# Patient Record
Sex: Female | Born: 1980 | Race: White | Hispanic: No | Marital: Married
Health system: Southern US, Community
[De-identification: ages and names within clinical notes are randomized; demographics above are authoritative.]

## PROBLEM LIST (undated history)

## (undated) DIAGNOSIS — I10 Essential (primary) hypertension: Secondary | ICD-10-CM

## (undated) DIAGNOSIS — R7303 Prediabetes: Secondary | ICD-10-CM

## (undated) DIAGNOSIS — E119 Type 2 diabetes mellitus without complications: Secondary | ICD-10-CM

## (undated) DIAGNOSIS — D649 Anemia, unspecified: Secondary | ICD-10-CM

## (undated) DIAGNOSIS — T4145XA Adverse effect of unspecified anesthetic, initial encounter: Secondary | ICD-10-CM

## (undated) DIAGNOSIS — E282 Polycystic ovarian syndrome: Secondary | ICD-10-CM

## (undated) DIAGNOSIS — F419 Anxiety disorder, unspecified: Secondary | ICD-10-CM

## (undated) DIAGNOSIS — R0602 Shortness of breath: Secondary | ICD-10-CM

## (undated) DIAGNOSIS — Z973 Presence of spectacles and contact lenses: Secondary | ICD-10-CM

## (undated) DIAGNOSIS — N8501 Benign endometrial hyperplasia: Secondary | ICD-10-CM

## (undated) DIAGNOSIS — N921 Excessive and frequent menstruation with irregular cycle: Secondary | ICD-10-CM

## (undated) HISTORY — PX: TONSILLECTOMY: SUR1361

## (undated) HISTORY — PX: WISDOM TOOTH EXTRACTION: SHX21

## (undated) HISTORY — PX: DILATION AND CURETTAGE OF UTERUS: SHX78

---

## 2001-08-28 DIAGNOSIS — T8859XA Other complications of anesthesia, initial encounter: Secondary | ICD-10-CM

## 2001-08-28 HISTORY — DX: Other complications of anesthesia, initial encounter: T88.59XA

## 2006-12-07 ENCOUNTER — Ambulatory Visit (HOSPITAL_COMMUNITY): Admission: RE | Admit: 2006-12-07 | Discharge: 2006-12-07 | Payer: Self-pay | Admitting: Obstetrics and Gynecology

## 2012-04-17 ENCOUNTER — Other Ambulatory Visit: Payer: Self-pay | Admitting: Obstetrics and Gynecology

## 2012-05-08 ENCOUNTER — Encounter (HOSPITAL_COMMUNITY): Payer: Self-pay

## 2012-05-10 ENCOUNTER — Encounter (HOSPITAL_COMMUNITY): Payer: Self-pay

## 2012-05-10 ENCOUNTER — Encounter (HOSPITAL_COMMUNITY)
Admission: RE | Admit: 2012-05-10 | Discharge: 2012-05-10 | Disposition: A | Discharge status: Home / Self Care | Payer: BC Managed Care – PPO | Source: Ambulatory Visit | Attending: Obstetrics and Gynecology | Admitting: Obstetrics and Gynecology

## 2012-05-10 HISTORY — DX: Polycystic ovarian syndrome: E28.2

## 2012-05-10 HISTORY — DX: Essential (primary) hypertension: I10

## 2012-05-10 HISTORY — DX: Anxiety disorder, unspecified: F41.9

## 2012-05-10 HISTORY — DX: Adverse effect of unspecified anesthetic, initial encounter: T41.45XA

## 2012-05-10 HISTORY — DX: Anemia, unspecified: D64.9

## 2012-05-10 LAB — CBC
Hemoglobin: 9.6 g/dL — ABNORMAL LOW (ref 12.0–15.0)
MCH: 24.7 pg — ABNORMAL LOW (ref 26.0–34.0)
RBC: 3.89 MIL/uL (ref 3.87–5.11)
WBC: 10.2 10*3/uL (ref 4.0–10.5)

## 2012-05-10 LAB — BASIC METABOLIC PANEL
CO2: 26 mEq/L (ref 19–32)
Chloride: 101 mEq/L (ref 96–112)
Glucose, Bld: 194 mg/dL — ABNORMAL HIGH (ref 70–99)
Potassium: 3.3 mEq/L — ABNORMAL LOW (ref 3.5–5.1)
Sodium: 138 mEq/L (ref 135–145)

## 2012-05-10 NOTE — Patient Instructions (Addendum)
Your procedure is scheduled on:05/14/12  Enter through the Main Entrance at :1130 AM Pick up desk phone and dial [phone] and inform us of your arrival.  Please call [phone] if you have any problems the morning of surgery.  Remember: Do not eat after midnight:MONDAY Do not drink after:9AM TUESDAY  Take these meds the morning of surgery with a sip of water:BLOOD PRESSURE MED  HOLD METFORMIN FOR 24 HOURS PRIOR TO SURGERY  DO NOT wear jewelry, eye make-up, lipstick,body lotion, or dark fingernail polish. Do not shave for 48 hours prior to surgery.  If you are to be admitted after surgery, leave suitcase in car until your room has been assigned. Patients discharged on the day of surgery will not be allowed to drive home.   Remember to use your Hibiclens as instructed.

## 2012-05-10 NOTE — Pre-Procedure Instructions (Signed)
I informed Dr. Arby Barrette that pt had an elevated temp during 2003 D&C. She has had surgery since then at [facility] w/o fever or problems.

## 2012-05-13 NOTE — H&P (Addendum)
31 yo with recent episode of irregular, heavy VB passing tissue presents for further evaluation.  Pt presented to office several weeks ago after episode of very heavy VB and passing tissue.  She brought tissue to office b/c of concern for SAB and pathology revealed endometrial polyp with CAH.    PMHx:  Anemia, HTN, PCOS PSHx:  D&C All:  None Meds:  Metoprolol, metformin, MTV, iron Shx:  Negative x 3 FHx:  DM, HTN  AF, VSS Gen - NAD ABd - soft, NT, ND CV - RRR Lungs - clear bilaterally Ext - NT, no edema  A/P:  Endometrial polyp with CAH Rec hysteroscopy with D&C to further evaluate endometrial cavity R/b/a discussed, questions answered, informed consent

## 2012-05-14 ENCOUNTER — Encounter (HOSPITAL_COMMUNITY): Payer: Self-pay

## 2012-05-14 ENCOUNTER — Encounter (HOSPITAL_COMMUNITY)
Admission: RE | Disposition: A | Discharge status: Home / Self Care | Payer: Self-pay | Source: Ambulatory Visit | Attending: Obstetrics and Gynecology

## 2012-05-14 ENCOUNTER — Encounter (HOSPITAL_COMMUNITY): Payer: Self-pay | Admitting: General Practice

## 2012-05-14 ENCOUNTER — Ambulatory Visit (HOSPITAL_COMMUNITY): Payer: BC Managed Care – PPO

## 2012-05-14 ENCOUNTER — Ambulatory Visit (HOSPITAL_COMMUNITY)
Admission: RE | Admit: 2012-05-14 | Discharge: 2012-05-14 | Disposition: A | Discharge status: Home / Self Care | Payer: BC Managed Care – PPO | Source: Ambulatory Visit | Attending: Obstetrics and Gynecology | Admitting: Obstetrics and Gynecology

## 2012-05-14 DIAGNOSIS — Z01812 Encounter for preprocedural laboratory examination: Secondary | ICD-10-CM | POA: Insufficient documentation

## 2012-05-14 DIAGNOSIS — Z01818 Encounter for other preprocedural examination: Secondary | ICD-10-CM | POA: Insufficient documentation

## 2012-05-14 DIAGNOSIS — N840 Polyp of corpus uteri: Secondary | ICD-10-CM | POA: Insufficient documentation

## 2012-05-14 DIAGNOSIS — I10 Essential (primary) hypertension: Secondary | ICD-10-CM | POA: Insufficient documentation

## 2012-05-14 DIAGNOSIS — N8502 Endometrial intraepithelial neoplasia [EIN]: Secondary | ICD-10-CM | POA: Insufficient documentation

## 2012-05-14 HISTORY — PX: HYSTEROSCOPY WITH D & C: SHX1775

## 2012-05-14 SURGERY — DILATATION AND CURETTAGE /HYSTEROSCOPY
Anesthesia: General | Site: Uterus | Wound class: Clean Contaminated

## 2012-05-14 MED ORDER — MIDAZOLAM HCL 2 MG/2ML IJ SOLN
INTRAMUSCULAR | Status: AC
  Filled 2012-05-14: qty 2

## 2012-05-14 MED ORDER — PROPOFOL 10 MG/ML IV EMUL
INTRAVENOUS | Status: DC | PRN
  Administered 2012-05-14: 50 mg via INTRAVENOUS
  Administered 2012-05-14: 200 mg via INTRAVENOUS

## 2012-05-14 MED ORDER — PROPOFOL 10 MG/ML IV EMUL
INTRAVENOUS | Status: AC
  Filled 2012-05-14: qty 20

## 2012-05-14 MED ORDER — MEDROXYPROGESTERONE ACETATE 10 MG PO TABS: ORAL_TABLET | ORAL | Status: DC

## 2012-05-14 MED ORDER — HYDROCODONE-ACETAMINOPHEN 5-500 MG PO TABS: 1.0000 | ORAL_TABLET | Freq: Four times a day (QID) | ORAL | Status: DC | PRN

## 2012-05-14 MED ORDER — LIDOCAINE HCL (CARDIAC) 20 MG/ML IV SOLN
INTRAVENOUS | Status: DC | PRN
  Administered 2012-05-14: 80 mg via INTRAVENOUS

## 2012-05-14 MED ORDER — GLYCINE 1.5 % IR SOLN
Status: DC | PRN
  Administered 2012-05-14: 3000 mL

## 2012-05-14 MED ORDER — LIDOCAINE HCL (CARDIAC) 20 MG/ML IV SOLN
INTRAVENOUS | Status: AC
  Filled 2012-05-14: qty 5

## 2012-05-14 MED ORDER — METOCLOPRAMIDE HCL 5 MG/ML IJ SOLN: 10.0000 mg | Freq: Once | INTRAMUSCULAR | Status: DC | PRN

## 2012-05-14 MED ORDER — KETOROLAC TROMETHAMINE 30 MG/ML IJ SOLN
INTRAMUSCULAR | Status: DC | PRN
  Administered 2012-05-14: 30 mg via INTRAVENOUS

## 2012-05-14 MED ORDER — FENTANYL CITRATE 0.05 MG/ML IJ SOLN: 25.0000 ug | INTRAMUSCULAR | Status: DC | PRN

## 2012-05-14 MED ORDER — FENTANYL CITRATE 0.05 MG/ML IJ SOLN
INTRAMUSCULAR | Status: DC | PRN
  Administered 2012-05-14: 50 ug via INTRAVENOUS
  Administered 2012-05-14: 100 ug via INTRAVENOUS

## 2012-05-14 MED ORDER — MEPERIDINE HCL 25 MG/ML IJ SOLN: 6.2500 mg | INTRAMUSCULAR | Status: DC | PRN

## 2012-05-14 MED ORDER — FENTANYL CITRATE 0.05 MG/ML IJ SOLN
INTRAMUSCULAR | Status: AC
  Filled 2012-05-14: qty 5

## 2012-05-14 MED ORDER — KETOROLAC TROMETHAMINE 30 MG/ML IJ SOLN
INTRAMUSCULAR | Status: AC
  Filled 2012-05-14: qty 1

## 2012-05-14 MED ORDER — ONDANSETRON HCL 4 MG/2ML IJ SOLN
INTRAMUSCULAR | Status: DC | PRN
  Administered 2012-05-14: 4 mg via INTRAVENOUS

## 2012-05-14 MED ORDER — DEXTROSE 5 % IV SOLN
2.0000 g | INTRAVENOUS | Status: AC
  Administered 2012-05-14: 2 g via INTRAVENOUS
  Filled 2012-05-14: qty 2

## 2012-05-14 MED ORDER — MIDAZOLAM HCL 5 MG/5ML IJ SOLN
INTRAMUSCULAR | Status: DC | PRN
  Administered 2012-05-14: 2 mg via INTRAVENOUS

## 2012-05-14 MED ORDER — ONDANSETRON HCL 4 MG/2ML IJ SOLN
INTRAMUSCULAR | Status: AC
  Filled 2012-05-14: qty 2

## 2012-05-14 MED ORDER — 0.9 % SODIUM CHLORIDE (POUR BTL) OPTIME
TOPICAL | Status: DC | PRN
  Administered 2012-05-14: 1000 mL

## 2012-05-14 MED ORDER — IBUPROFEN 200 MG PO TABS: 600.0000 mg | ORAL_TABLET | Freq: Four times a day (QID) | ORAL | Status: DC | PRN

## 2012-05-14 MED ORDER — LACTATED RINGERS IV SOLN
INTRAVENOUS | Status: DC
  Administered 2012-05-14 (×2): via INTRAVENOUS

## 2012-05-14 MED ORDER — LIDOCAINE HCL 1 % IJ SOLN
INTRAMUSCULAR | Status: DC | PRN
  Administered 2012-05-14: 20 mL

## 2012-05-14 SURGICAL SUPPLY — 21 items
ABLATOR ENDOMETRIAL BIPOLAR (ABLATOR) IMPLANT
CANISTER SUCTION 2500CC (MISCELLANEOUS) ×2 IMPLANT
CATH ROBINSON RED A/P 16FR (CATHETERS) ×2 IMPLANT
CATH THERMACHOICE III (CATHETERS) IMPLANT
CLOTH BEACON ORANGE TIMEOUT ST (SAFETY) ×2 IMPLANT
CONTAINER PREFILL 10% NBF 60ML (FORM) ×4 IMPLANT
DRESSING TELFA 8X3 (GAUZE/BANDAGES/DRESSINGS) ×2 IMPLANT
ELECT REM PT RETURN 9FT ADLT (ELECTROSURGICAL) ×2
ELECTRODE REM PT RTRN 9FT ADLT (ELECTROSURGICAL) ×1 IMPLANT
GLOVE BIO SURGEON STRL SZ 6.5 (GLOVE) ×2 IMPLANT
GLOVE BIOGEL PI IND STRL 7.0 (GLOVE) ×1 IMPLANT
GLOVE BIOGEL PI INDICATOR 7.0 (GLOVE) ×1
GLOVE ECLIPSE 6.0 STRL STRAW (GLOVE) ×1 IMPLANT
GLOVE INDICATOR 6.5 STRL GRN (GLOVE) ×1 IMPLANT
GOWN PREVENTION PLUS LG XLONG (DISPOSABLE) ×2 IMPLANT
GOWN STRL REIN XL XLG (GOWN DISPOSABLE) ×2 IMPLANT
LOOP ANGLED CUTTING 22FR (CUTTING LOOP) IMPLANT
PACK HYSTEROSCOPY LF (CUSTOM PROCEDURE TRAY) ×2 IMPLANT
PAD OB MATERNITY 4.3X12.25 (PERSONAL CARE ITEMS) ×2 IMPLANT
TOWEL OR 17X24 6PK STRL BLUE (TOWEL DISPOSABLE) ×4 IMPLANT
WATER STERILE IRR 1000ML POUR (IV SOLUTION) ×2 IMPLANT

## 2012-05-14 NOTE — Anesthesia Postprocedure Evaluation (Signed)
  Anesthesia Post-op Note  Patient: Melissa Ray  Procedure(s) Performed: Procedure(s) (LRB) with comments: DILATATION AND CURETTAGE /HYSTEROSCOPY (N/A) Patient is awake and responsive. Pain and nausea are reasonably well controlled. Vital signs are stable and clinically acceptable. Oxygen saturation is clinically acceptable. There are no apparent anesthetic complications at this time. Patient is ready for discharge.

## 2012-05-14 NOTE — Anesthesia Preprocedure Evaluation (Signed)
Anesthesia Evaluation  Patient identified by MRN, date of birth, ID band Patient awake    Reviewed: Allergy & Precautions, H&P , NPO status , Patient's Chart, lab work & pertinent test results  Airway Mallampati: III TM Distance: >3 FB Neck ROM: Full    Dental No notable dental hx. (+) Teeth Intact   Pulmonary neg pulmonary ROS,  breath sounds clear to auscultation  Pulmonary exam normal       Cardiovascular hypertension, Pt. on medications Rhythm:Regular Rate:Normal     Neuro/Psych Anxiety negative neurological ROS     GI/Hepatic negative GI ROS, Neg liver ROS,   Endo/Other  Morbid obesity  Renal/GU negative Renal ROS  negative genitourinary   Musculoskeletal negative musculoskeletal ROS (+)   Abdominal (+) + obese,   Peds  Hematology negative hematology ROS (+)   Anesthesia Other Findings   Reproductive/Obstetrics PCOS                           Anesthesia Physical Anesthesia Plan  ASA: II  Anesthesia Plan: General   Post-op Pain Management:    Induction: Intravenous  Airway Management Planned: LMA  Additional Equipment:   Intra-op Plan:   Post-operative Plan: Extubation in OR  Informed Consent: I have reviewed the patients History and Physical, chart, labs and discussed the procedure including the risks, benefits and alternatives for the proposed anesthesia with the patient or authorized representative who has indicated his/her understanding and acceptance.   Dental advisory given  Plan Discussed with: CRNA, Anesthesiologist and Surgeon  Anesthesia Plan Comments:         Anesthesia Quick Evaluation

## 2012-05-14 NOTE — Transfer of Care (Signed)
Immediate Anesthesia Transfer of Care Note  Patient: Melissa Ray  Procedure(s) Performed: Procedure(s) (LRB) with comments: DILATATION AND CURETTAGE /HYSTEROSCOPY (N/A)  Patient Location: PACU  Anesthesia Type: General  Level of Consciousness: awake, alert  and oriented  Airway & Oxygen Therapy: Patient Spontanous Breathing and Patient connected to nasal cannula oxygen  Post-op Assessment: Report given to PACU RN and Post -op Vital signs reviewed and stable  Post vital signs: stable  Complications: No apparent anesthesia complications

## 2012-05-15 ENCOUNTER — Encounter (HOSPITAL_COMMUNITY): Payer: Self-pay | Admitting: Obstetrics and Gynecology

## 2012-05-15 NOTE — Op Note (Signed)
Melissa Ray, Melissa Ray            ACCOUNT NO.:  [id number]  MEDICAL RECORD NO.:  [id number]  LOCATION:  [facility]                          FACILITY:  [facility]  PHYSICIAN:  Zelphia Cairo, MD    DATE OF BIRTH:  [birth year]  DATE OF PROCEDURE:  05/14/2012 DATE OF DISCHARGE:  05/14/2012                              OPERATIVE REPORT   PREOPERATIVE DIAGNOSIS:  Complex atypical hyperplasia.  POSTOPERATIVE DIAGNOSIS:  Complex atypical hyperplasia, pathology pending.  PROCEDURE: 1. Cervical block. 2. Hysteroscopy. 3. D and C.  SURGEON:  Zelphia Cairo, MD  SPECIMEN:  Endometrial curettings.  COMPLICATIONS:  None.  FLUID DEFICIT:  100 mL, glycine.  CONDITION:  Stable to recovery room.  PROCEDURE:  [name] was taken to the operating room after informed consent was obtained.  She was given general anesthesia, placed in the dorsal lithotomy position using Allen stirrups.  She was prepped and draped in sterile fashion and an in-and-out catheter was used to drain her bladder.  Bivalve speculum was placed in the vagina and single-tooth tenaculum was placed on the anterior lip of the cervix.  A 10 mL of 1% lidocaine was used to provide a cervical block.  The cervix was easily dilated using Pratt dilators and hysteroscope was inserted into the uterine cavity.  Hysteroscope was inserted, multiple polypoid masses was noted filling the uterine cavity.  [name] could not be visualized due to multiple polypoid masses.  Hysteroscope was removed and a gentle curetting was performed throughout the endometrial cavity.  Multiple polypoid masses and tissue, was placed on Telfa, and passed off to be sent to Pathology.  The tenaculum was removed from the cervix.  The cervix was hemostatic.  Speculum was removed.  Sponge, lap, needle, and instrument counts were correct x2.    Zelphia Cairo, MD    GA/MEDQ  D:  05/14/2012  T:  05/15/2012  Job:  [id number]

## 2012-06-11 ENCOUNTER — Ambulatory Visit: Payer: BC Managed Care – PPO | Attending: Gynecologic Oncology | Admitting: Gynecologic Oncology

## 2012-06-11 ENCOUNTER — Encounter: Payer: Self-pay | Admitting: Gynecologic Oncology

## 2012-06-11 VITALS — BP 140/92 | HR 80 | Temp 99.0°F | Resp 16 | Ht 64.61 in | Wt 225.8 lb

## 2012-06-11 DIAGNOSIS — I10 Essential (primary) hypertension: Secondary | ICD-10-CM | POA: Insufficient documentation

## 2012-06-11 DIAGNOSIS — C541 Malignant neoplasm of endometrium: Secondary | ICD-10-CM

## 2012-06-11 DIAGNOSIS — N8502 Endometrial intraepithelial neoplasia [EIN]: Secondary | ICD-10-CM | POA: Insufficient documentation

## 2012-06-11 MED ORDER — MEDROXYPROGESTERONE ACETATE 10 MG PO TABS: 10.0000 mg | ORAL_TABLET | Freq: Every day | ORAL | Status: DC

## 2012-06-11 NOTE — Patient Instructions (Addendum)
MRI of the pelvis has been scheduled for 06/13/2012. If this is negative, the plan would be placement of an IUD by Dr. Renaldo Fiddler with subsequent endometrial biopsy at 3, 6 and 9 months. If the endometrial biopsy at either 6 or 9 months is within normal limits I recommend a D&C prior to intentional attempt to become pregnant.    If complex atypical hyperplasia persists then would be strong consideration for the addition of aygestin in concert with the IUD.  If the major adenocarcinoma persists then the options would be placement of a second IUD or hysterectomy.  Advise Provera at 10 mg daily until placement of a Mirena IUD and to also seriously consider measures that promote weight loss.  Followup in 6 months with GYN Onc

## 2012-06-11 NOTE — Progress Notes (Signed)
Consult Note: Gyn-Onc  Consult was requested by Dr. Renaldo Fiddler for the evaluation of Melissa Ray 31 y.o. female  CC:  Chief Complaint  Patient presents with  . Endometrial cancer     HPI: 31 y/o G0 with Patient with intermittent periods since 31 years old.  D&C in her 29's with a diagnosis of hyperplasia.  Trial of depo provera for 1 year with reversal of pathology on subsequent unterine evaluation.  Lost to f/u until 2008.  D&C in 2008 demonstrated hyperplasia and started on metformin and advised to lose weight.  Dx of PCO  Periods were inconsistent with episodes of menorrhagia.  Patient with passage of polyp with bleeding.  Path c/w CAH.  Patient underwent hysteroscopy and D&C.  Path CAH with focus suspicious for adenocarcinoma. Currently of provera 10 mg three times daily.  Reports 20 pound weight loss since May 2013.  No bloating or abdominal pain. Reports fatigue.  No changes in bowel or bladder habits.   Current Meds:  Outpatient Encounter Prescriptions as of 06/11/2012  Medication Sig Dispense Refill  . ibuprofen (MOTRIN IB) 200 MG tablet Take 3 tablets (600 mg total) by mouth every 6 (six) hours as needed for pain.  30 tablet  0  . IRON PO Take 1 tablet by mouth daily.      . medroxyPROGESTERone (PROVERA) 10 MG tablet 3 tablets PO QHS x 30 days  90 tablet  0  . metFORMIN (GLUCOPHAGE-XR) 500 MG 24 hr tablet Take 1,000 mg by mouth at bedtime.      . metoprolol-hydrochlorothiazide (LOPRESSOR HCT) 50-25 MG per tablet Take 1 tablet by mouth daily.      . Multiple Vitamin (MULTIVITAMIN WITH MINERALS) TABS Take 1 tablet by mouth daily.      Marland Kitchen HYDROcodone-acetaminophen (VICODIN) 5-500 MG per tablet Take 1-2 tablets by mouth every 6 (six) hours as needed for pain.  30 tablet  0  . loratadine (CLARITIN) 10 MG tablet Take 10 mg by mouth daily as needed. Allergies        Allergy: No Known Allergies  Social Hx:   History   Social History  . Marital Status: Married    Spouse Name: N/A      Number of Children: N/A  . Years of Education: N/A   Occupational History  . Not on file.   Social History Main Topics  . Smoking status: Never Smoker   . Smokeless tobacco: Not on file  . Alcohol Use: No  . Drug Use: No  . Sexually Active: Yes   Other Topics Concern  . Not on file   Social History Narrative  . No narrative on file  Married for 6 years.  Past Surgical Hx:  Past Surgical History  Procedure Date  . Tonsillectomy   . Dilation and curettage of uterus     x 3  . Hysteroscopy w/d&c 05/14/2012    Procedure: DILATATION AND CURETTAGE /HYSTEROSCOPY;  Surgeon: Zelphia Cairo, MD;  Location: [facility];  Service: Gynecology;  Laterality: N/A;    Past Medical Hx:  Past Medical History  Diagnosis Date  . Hypertension   . Anxiety     situational  . Polycystic disease, ovaries   . Anemia   . Complication of anesthesia 2003    during D&C, pt states her temp spiked during surgery    Past Gynecological History:   G0 menarche 12, irregular menses, PCO Patient's last menstrual period was 04/16/2012.  Family Hx: No family history on file.  Review  of Systems:  Constitutional  Feels well, reports fatigue. Cardiovascular  No chest pain, mild shortness of breath with exertion, or edema  Pulmonary  No cough or wheeze.  Gastro Intestinal  No nausea, vomitting, or diarrhoea. No bright red blood per rectum, no abdominal pain, change in bowel movement, or constipation.  Genito Urinary  No frequency, urgency, dysuria, continued vaginal spotting crampy uterine pain. Musculo Skeletal  No myalgia, arthralgia, joint swelling or pain  Neurologic  No weakness, numbness, change in gait,  Psychology  No depression,reports anxiety and worry, no  insomnia.   Vitals:  Blood pressure 140/92, pulse 80, temperature 99 F (37.2 C), temperature source Oral, resp. rate 16, height 5' 4.61" (1.641 m), weight 225 lb 12.8 oz (102.422 kg), last menstrual period 04/16/2012.  Physical  Exam: WD in NAD Neck  Supple NROM, without any enlargements.  Lymph Node Survey No cervical supraclavicular or inguinal adenopathy Cardiovascular  Pulse normal rate, regularity and rhythm. S1 and S2 normal.  Lungs  Clear to auscultation bilateraly, without wheezes/crackles/rhonchi.   Skin  No rash/lesions/breakdown  Psychiatry  Alert and oriented to person, place, and time  Abdomen  Normoactive bowel sounds, abdomen soft, non-tender and obese. No palpable pelvic masses. Back No CVA tenderness Genito Urinary  Vulva/vagina: Normal external female genitalia.  No lesions. No discharge or bleeding.  Bladder/urethra:  No lesions or masses  Vagina: blood in the vaginal vault.  Cervix: Normal appearing, no lesions.  Uterus: mobile, unable to assess size, no parametrial involvement or nodularity.  Adnexa: No palpable masses. Rectal  Good tone, no masses no cul de sac nodularity.  Extremities  No bilateral cyanosis, clubbing or edema.   Assessment/Plan:  Ms. Melissa Ray  is a 31 y.o.  year old  with complex atypical hyperplasia suspicious for adenocarcinoma.  The patient is not yet satisfied with her parity.  A discussion was held with her and her husband in standard of care would be hysterectomy because of the 30% possibility of microinvasive disease. However in the light of the fact that she still desirous of having children on alternative would be placement o a Mirena IUD if an MRI of the pelvis the not indicate myoinvasion.  An MRI of the pelvis has been scheduled for 06/13/2012. If this is negative, the plan would be placement of an IUD by Dr. Renaldo Fiddler with subsequent endometrial biopsy at 3, 6 and 9 months. If the endometrial biopsy at either 6 or 9 months is within normal limits I would recommend a D&C prior to intentional attempt to become pregnant.    If complex atypical hyperplasia persists then would be strong consideration for the addition of aygestin in concert with the  IUD.  If the major adenocarcinoma persists then the options would be placement of a second IUD or hysterectomy.  I've advised the patient to continue Provera at 10 mg daily until placement of a Mirena IUD and to also serous to consider measures that promote weight loss.  Followup in 6 months with GYN Hester Mates, MD, PhD 06/11/2012, 3:49 PM

## 2012-06-13 ENCOUNTER — Ambulatory Visit (HOSPITAL_COMMUNITY)
Admission: RE | Admit: 2012-06-13 | Discharge: 2012-06-13 | Disposition: A | Discharge status: Home / Self Care | Payer: BC Managed Care – PPO | Source: Ambulatory Visit | Attending: Gynecologic Oncology | Admitting: Gynecologic Oncology

## 2012-06-13 DIAGNOSIS — N926 Irregular menstruation, unspecified: Secondary | ICD-10-CM | POA: Insufficient documentation

## 2012-06-13 DIAGNOSIS — N8500 Endometrial hyperplasia, unspecified: Secondary | ICD-10-CM | POA: Insufficient documentation

## 2012-06-13 DIAGNOSIS — C541 Malignant neoplasm of endometrium: Secondary | ICD-10-CM

## 2012-06-13 DIAGNOSIS — N72 Inflammatory disease of cervix uteri: Secondary | ICD-10-CM | POA: Insufficient documentation

## 2012-06-13 DIAGNOSIS — E282 Polycystic ovarian syndrome: Secondary | ICD-10-CM | POA: Insufficient documentation

## 2012-06-13 MED ORDER — GADOBENATE DIMEGLUMINE 529 MG/ML IV SOLN
20.0000 mL | Freq: Once | INTRAVENOUS | Status: AC | PRN
  Administered 2012-06-13: 20 mL via INTRAVENOUS

## 2012-06-14 ENCOUNTER — Telehealth: Payer: Self-pay | Admitting: Gynecologic Oncology

## 2012-06-14 NOTE — Telephone Encounter (Signed)
Message left for patient with MRI results and Dr. Forrestine Him recommendations for IUD placement as soon as possible. Records were faxed to her referring physician's office with the last office note, MRI results, and recommendation for IUD placement. Instructed to call for any questions or concerns.

## 2012-06-18 ENCOUNTER — Telehealth: Payer: Self-pay | Admitting: Gynecologic Oncology

## 2012-06-18 NOTE — Telephone Encounter (Signed)
Message left for patient.  Called to follow up with patient and to make sure that she received my message from 06/14/12 about having an IUD placed.  Instructed to please call the office with an update.

## 2012-06-18 NOTE — Telephone Encounter (Signed)
Spoke with the patient about status of IUD placement.  Pt stating that she has not heard anything from her GYN's office but she is planning on calling in the morning to follow up.  Instructed to call our office for any needs, questions, or concerns.

## 2012-06-20 ENCOUNTER — Encounter (HOSPITAL_COMMUNITY): Payer: Self-pay | Admitting: Anesthesiology

## 2012-06-20 ENCOUNTER — Observation Stay (HOSPITAL_COMMUNITY)
Admission: AD | Admit: 2012-06-20 | Discharge: 2012-06-20 | Disposition: A | Discharge status: Home / Self Care | Payer: BC Managed Care – PPO | Source: Ambulatory Visit | Attending: Obstetrics and Gynecology | Admitting: Obstetrics and Gynecology

## 2012-06-20 DIAGNOSIS — C55 Malignant neoplasm of uterus, part unspecified: Secondary | ICD-10-CM | POA: Insufficient documentation

## 2012-06-20 DIAGNOSIS — D649 Anemia, unspecified: Principal | ICD-10-CM | POA: Insufficient documentation

## 2012-06-20 LAB — PREPARE RBC (CROSSMATCH)

## 2012-06-20 MED ORDER — DIPHENHYDRAMINE HCL 25 MG PO CAPS
25.0000 mg | ORAL_CAPSULE | Freq: Once | ORAL | Status: AC
  Administered 2012-06-20: 25 mg via ORAL
  Filled 2012-06-20: qty 1

## 2012-06-20 MED ORDER — ACETAMINOPHEN 325 MG PO TABS
650.0000 mg | ORAL_TABLET | Freq: Once | ORAL | Status: AC
  Administered 2012-06-20: 650 mg via ORAL
  Filled 2012-06-20: qty 2

## 2012-06-20 NOTE — Progress Notes (Signed)
Spoke with Dr. Renaldo Fiddler and she said patient was ready to be discharged home. She will follow up with pt. Next week. Pt. Is going home with husband.

## 2012-06-20 NOTE — H&P (Signed)
31 yo G0 with adenocarcinoma of the uterus on PO progestin therapy presents with symptomatic anemia for blood transfusion.  Pt was evaluated in office yesterday and CBC returned last night with Hgb of 5.3.  Pt with c/o fatigue and SOB.    PMHx:  PCOS, HTN, adenocarcinoma of the uterus PSx:  D&C x 2 All:  None Meds:  See list SHx:  No tobacco  AF, VSS Gen - NAD CV - tachycardic, regular rhythm Lungs - clear bilaterally Abd - soft, NT/ND PV - deferred today  A/P:  Anemia, adenocarcinoma of the uterus Plan for 2 u PRBC transfusion

## 2012-06-20 NOTE — Progress Notes (Signed)
UR Chart review completed.  

## 2012-06-21 LAB — TYPE AND SCREEN: Unit division: 0

## 2012-06-28 ENCOUNTER — Encounter (HOSPITAL_COMMUNITY): Payer: Self-pay | Admitting: Anesthesiology

## 2012-06-28 ENCOUNTER — Ambulatory Visit (HOSPITAL_COMMUNITY)
Admission: RE | Admit: 2012-06-28 | Discharge: 2012-06-28 | Disposition: A | Discharge status: Home / Self Care | Payer: BC Managed Care – PPO | Source: Intra-hospital | Attending: Obstetrics and Gynecology | Admitting: Obstetrics and Gynecology

## 2012-06-28 ENCOUNTER — Ambulatory Visit (HOSPITAL_COMMUNITY): Payer: BC Managed Care – PPO | Admitting: Anesthesiology

## 2012-06-28 ENCOUNTER — Encounter (HOSPITAL_COMMUNITY)
Admission: RE | Disposition: A | Discharge status: Home / Self Care | Payer: Self-pay | Attending: Obstetrics and Gynecology

## 2012-06-28 ENCOUNTER — Encounter (HOSPITAL_COMMUNITY): Payer: Self-pay | Admitting: *Deleted

## 2012-06-28 DIAGNOSIS — C549 Malignant neoplasm of corpus uteri, unspecified: Secondary | ICD-10-CM | POA: Insufficient documentation

## 2012-06-28 DIAGNOSIS — N920 Excessive and frequent menstruation with regular cycle: Principal | ICD-10-CM | POA: Insufficient documentation

## 2012-06-28 HISTORY — PX: DILATION AND CURETTAGE OF UTERUS: SHX78

## 2012-06-28 LAB — COMPREHENSIVE METABOLIC PANEL
ALT: 19 U/L (ref 0–35)
Alkaline Phosphatase: 53 U/L (ref 39–117)
BUN: 10 mg/dL (ref 6–23)
CO2: 24 mEq/L (ref 19–32)
Chloride: 101 mEq/L (ref 96–112)
GFR calc Af Amer: >90 mL/min (ref >90)
Glucose, Bld: 94 mg/dL (ref 70–99)
Potassium: 3.2 mEq/L — ABNORMAL LOW (ref 3.5–5.1)
Sodium: 138 mEq/L (ref 135–145)
Total Bilirubin: 0.9 mg/dL (ref 0.3–1.2)
Total Protein: 6.3 g/dL (ref 6.0–8.3)

## 2012-06-28 LAB — CBC
HCT: 23 % — ABNORMAL LOW (ref 36.0–46.0)
Hemoglobin: 6.8 g/dL — CL (ref 12.0–15.0)
MCHC: 29.6 g/dL — ABNORMAL LOW (ref 30.0–36.0)
RBC: 2.81 MIL/uL — ABNORMAL LOW (ref 3.87–5.11)
WBC: 11.4 10*3/uL — ABNORMAL HIGH (ref 4.0–10.5)

## 2012-06-28 LAB — PROTIME-INR: Prothrombin Time: 13.9 seconds (ref 11.6–15.2)

## 2012-06-28 LAB — PREPARE RBC (CROSSMATCH)

## 2012-06-28 SURGERY — DILATION AND CURETTAGE
Anesthesia: Choice | Site: Uterus | Wound class: Clean Contaminated

## 2012-06-28 MED ORDER — FENTANYL CITRATE 0.05 MG/ML IJ SOLN
25.0000 ug | INTRAMUSCULAR | Status: DC | PRN
  Administered 2012-06-28: 50 ug via INTRAVENOUS

## 2012-06-28 MED ORDER — ONDANSETRON HCL 4 MG/2ML IJ SOLN
INTRAMUSCULAR | Status: AC
  Filled 2012-06-28: qty 2

## 2012-06-28 MED ORDER — PROPOFOL 10 MG/ML IV EMUL
INTRAVENOUS | Status: AC
  Filled 2012-06-28: qty 60

## 2012-06-28 MED ORDER — KETOROLAC TROMETHAMINE 30 MG/ML IJ SOLN: 15.0000 mg | Freq: Once | INTRAMUSCULAR | Status: DC | PRN

## 2012-06-28 MED ORDER — PHENYLEPHRINE HCL 10 MG/ML IJ SOLN
INTRAMUSCULAR | Status: AC
  Filled 2012-06-28: qty 1

## 2012-06-28 MED ORDER — PROPOFOL 10 MG/ML IV EMUL
INTRAVENOUS | Status: DC | PRN
  Administered 2012-06-28 (×2): 200 mg via INTRAVENOUS
  Administered 2012-06-28: 40 mg via INTRAVENOUS
  Administered 2012-06-28: 20 mg via INTRAVENOUS
  Administered 2012-06-28: 40 mg via INTRAVENOUS

## 2012-06-28 MED ORDER — CEFAZOLIN SODIUM-DEXTROSE 2-3 GM-% IV SOLR
2.0000 g | INTRAVENOUS | Status: AC
  Administered 2012-06-28: 2 g via INTRAVENOUS
  Filled 2012-06-28: qty 50

## 2012-06-28 MED ORDER — MIDAZOLAM HCL 5 MG/5ML IJ SOLN
INTRAMUSCULAR | Status: DC | PRN
  Administered 2012-06-28: 2 mg via INTRAVENOUS

## 2012-06-28 MED ORDER — SUCCINYLCHOLINE CHLORIDE 20 MG/ML IJ SOLN
INTRAMUSCULAR | Status: AC
  Filled 2012-06-28: qty 10

## 2012-06-28 MED ORDER — FENTANYL CITRATE 0.05 MG/ML IJ SOLN
INTRAMUSCULAR | Status: AC
  Administered 2012-06-28: 50 ug via INTRAVENOUS
  Filled 2012-06-28: qty 2

## 2012-06-28 MED ORDER — MIDAZOLAM HCL 2 MG/2ML IJ SOLN
INTRAMUSCULAR | Status: AC
  Filled 2012-06-28: qty 2

## 2012-06-28 MED ORDER — FENTANYL CITRATE 0.05 MG/ML IJ SOLN
INTRAMUSCULAR | Status: AC
  Filled 2012-06-28: qty 2

## 2012-06-28 MED ORDER — PROPOFOL 10 MG/ML IV EMUL
INTRAVENOUS | Status: AC
  Filled 2012-06-28: qty 20

## 2012-06-28 MED ORDER — LIDOCAINE HCL (CARDIAC) 20 MG/ML IV SOLN
INTRAVENOUS | Status: AC
  Filled 2012-06-28: qty 5

## 2012-06-28 MED ORDER — FENTANYL CITRATE 0.05 MG/ML IJ SOLN
INTRAMUSCULAR | Status: DC | PRN
  Administered 2012-06-28 (×2): 50 ug via INTRAVENOUS

## 2012-06-28 MED ORDER — LACTATED RINGERS IV SOLN
INTRAVENOUS | Status: DC
  Administered 2012-06-28 (×2): via INTRAVENOUS

## 2012-06-28 MED ORDER — MEPERIDINE HCL 25 MG/ML IJ SOLN: 6.2500 mg | INTRAMUSCULAR | Status: DC | PRN

## 2012-06-28 MED ORDER — LIDOCAINE HCL (CARDIAC) 20 MG/ML IV SOLN
INTRAVENOUS | Status: DC | PRN
  Administered 2012-06-28: 20 mg via INTRAVENOUS

## 2012-06-28 MED ORDER — ROCURONIUM BROMIDE 100 MG/10ML IV SOLN
INTRAVENOUS | Status: DC | PRN
  Administered 2012-06-28: 5 mg via INTRAVENOUS

## 2012-06-28 MED ORDER — FAMOTIDINE IN NACL 20-0.9 MG/50ML-% IV SOLN
20.0000 mg | Freq: Once | INTRAVENOUS | Status: AC
  Administered 2012-06-28: 20 mg via INTRAVENOUS
  Filled 2012-06-28: qty 50

## 2012-06-28 MED ORDER — ONDANSETRON HCL 4 MG/2ML IJ SOLN: 4.0000 mg | Freq: Once | INTRAMUSCULAR | Status: DC | PRN

## 2012-06-28 MED ORDER — SUCCINYLCHOLINE CHLORIDE 20 MG/ML IJ SOLN
INTRAMUSCULAR | Status: DC | PRN
  Administered 2012-06-28: 120 mg via INTRAVENOUS

## 2012-06-28 MED ORDER — LIDOCAINE HCL 1 % IJ SOLN
INTRAMUSCULAR | Status: DC | PRN
  Administered 2012-06-28: 20 mL

## 2012-06-28 MED ORDER — MEGESTROL ACETATE 40 MG PO TABS
120.0000 mg | ORAL_TABLET | Freq: Once | ORAL | Status: AC
  Administered 2012-06-28: 120 mg via ORAL
  Filled 2012-06-28: qty 3

## 2012-06-28 SURGICAL SUPPLY — 20 items
CATH ROBINSON RED A/P 16FR (CATHETERS) ×2 IMPLANT
CLOTH BEACON ORANGE TIMEOUT ST (SAFETY) ×2 IMPLANT
CONTAINER PREFILL 10% NBF 60ML (FORM) ×4 IMPLANT
DRESSING TELFA 8X3 (GAUZE/BANDAGES/DRESSINGS) ×2 IMPLANT
GLOVE BIO SURGEON STRL SZ8 (GLOVE) ×4 IMPLANT
GOWN PREVENTION PLUS LG XLONG (DISPOSABLE) ×2 IMPLANT
GOWN STRL REIN XL XLG (GOWN DISPOSABLE) ×2 IMPLANT
HOSE CONNECTING 18IN BERKELEY (TUBING) ×1 IMPLANT
NDL SPNL 22GX3.5 QUINCKE BK (NEEDLE) ×1 IMPLANT
NEEDLE SPNL 22GX3.5 QUINCKE BK (NEEDLE) ×2 IMPLANT
PACK VAGINAL MINOR WOMEN LF (CUSTOM PROCEDURE TRAY) ×2 IMPLANT
PAD OB MATERNITY 4.3X12.25 (PERSONAL CARE ITEMS) ×2 IMPLANT
PAD PREP 24X48 CUFFED NSTRL (MISCELLANEOUS) ×2 IMPLANT
SET BERKELEY SUCTION TUBING (SUCTIONS) ×1 IMPLANT
SYR CONTROL 10ML LL (SYRINGE) ×2 IMPLANT
TOP DISP BERKELEY (MISCELLANEOUS) ×1 IMPLANT
TOWEL OR 17X24 6PK STRL BLUE (TOWEL DISPOSABLE) ×4 IMPLANT
VACURETTE 6 ASPIR F TIP BERK (CANNULA) ×1 IMPLANT
VACURETTE 7MM CVD STRL WRAP (CANNULA) ×1 IMPLANT
WATER STERILE IRR 1000ML POUR (IV SOLUTION) ×2 IMPLANT

## 2012-06-28 NOTE — MAU Note (Signed)
Pt here for D&C by Dr. Henderson Cloud.

## 2012-06-28 NOTE — Progress Notes (Signed)
Dr. Henderson Cloud informed of pt's arrival, is putting in orders.

## 2012-06-28 NOTE — Anesthesia Postprocedure Evaluation (Signed)
Anesthesia Post Note  Patient: Melissa Ray  Procedure(s) Performed: Procedure(s) (LRB): DILATATION AND CURETTAGE (N/A)  Anesthesia type: General  Patient location: PACU  Post pain: Pain level controlled  Post assessment: Post-op Vital signs reviewed  Last Vitals:  Filed Vitals:   06/28/12 2043  BP: 137/85  Pulse: 112  Temp:   Resp: 16    Post vital signs: Reviewed  Level of consciousness: sedated  Complications: No apparent anesthesia complicationsfj

## 2012-06-28 NOTE — Brief Op Note (Signed)
06/28/2012  7:16 PM  PATIENT:  Melissa Ray  31 y.o. female  PRE-OPERATIVE DIAGNOSIS:  bleeding, endometrial cancer  POST-OPERATIVE DIAGNOSIS:  bleeding, endometrial cancer  PROCEDURE:  Procedure(s) (LRB) with comments: DILATATION AND CURETTAGE (N/A) - SUCTION  USED  SURGEON:  Surgeon(s) and Role:    * Leslie Andrea, MD - Primary  PHYSICIAN ASSISTANT:   ASSISTANTS: none   ANESTHESIA:   general  EBL:     BLOOD ADMINISTERED:none  DRAINS: none   LOCAL MEDICATIONS USED:  LIDOCAINE   SPECIMEN:  Source of Specimen:  endometrial currettings  DISPOSITION OF SPECIMEN:  PATHOLOGY  COUNTS:  YES  TOURNIQUET:  * No tourniquets in log *  DICTATION: .Other Dictation: Dictation Number [id number]  PLAN OF CARE: Discharge to home after PACU  PATIENT DISPOSITION:  PACU - hemodynamically stable.   Delay start of Pharmacological VTE agent (>24hrs) due to surgical blood loss or risk of bleeding: not applicable

## 2012-06-28 NOTE — Anesthesia Preprocedure Evaluation (Addendum)
Anesthesia Evaluation  Patient identified by MRN, date of birth, ID band Patient awake    Reviewed: Allergy & Precautions, H&P , Patient's Chart, lab work & pertinent test results, reviewed documented beta blocker date and time   History of Anesthesia Complications (+) AWARENESS UNDER ANESTHESIA  Airway Mallampati: III TM Distance: >3 FB Neck ROM: full    Dental No notable dental hx.    Pulmonary neg pulmonary ROS,  breath sounds clear to auscultation  Pulmonary exam normal       Cardiovascular Exercise Tolerance: Good hypertension, negative cardio ROS  Rhythm:regular Rate:Normal     Neuro/Psych negative neurological ROS  negative psych ROS   GI/Hepatic negative GI ROS, Neg liver ROS,   Endo/Other  negative endocrine ROS  Renal/GU negative Renal ROS     Musculoskeletal   Abdominal   Peds  Hematology negative hematology ROS (+)   Anesthesia Other Findings Hypertension     Anxiety   situational    Polycystic disease, ovaries     Anemia        Complication of anesthesia 2003 during D&C, pt states her temp spiked during surgery Endometrial cancer        History of blood transfusion        Reproductive/Obstetrics negative OB ROS                          Anesthesia Physical Anesthesia Plan  ASA: III and Emergent  Anesthesia Plan: General ETT   Post-op Pain Management:    Induction:   Airway Management Planned:   Additional Equipment:   Intra-op Plan:   Post-operative Plan:   Informed Consent: I have reviewed the patients History and Physical, chart, labs and discussed the procedure including the risks, benefits and alternatives for the proposed anesthesia with the patient or authorized representative who has indicated his/her understanding and acceptance.   Dental Advisory Given  Plan Discussed with: CRNA and Surgeon  Anesthesia Plan Comments:        discussed past  pyrexia with distant past anesthetic.  I agree with the patient that this was likely not malignant hyperthermia but more likely an isolated fever of other origin.  We will continue with a standard general anesthetic.  Her questions were answered. Anesthesia Quick Evaluation

## 2012-06-28 NOTE — H&P (Signed)
Melissa Ray is an 31 y.o. female with endometrial hyperplasia with a focus of well differentiated adenocarcinoma on D&C about 6 weeks ago.  Gyn Onc consultation obtained and recommended Mirena IUD therapy. Patient developed heavy bleeding with a Hgb of approximately 5 and received a transfusion of PRBCs last week. IUD was also placed.  Was treated with Provera 30mg  qd that had been weaned down to 10mg  with IUD. Presented to office today with heavy bleeding and passing IUD which was removed. Cx was dilated and heavy bleeding continues. No SOB or dizziness.  Pertinent Gynecological History: Menses: flow is excessive with use of many pads or tampons on heaviest days Bleeding: dysfunctional uterine bleeding Contraception: none DES exposure: denies Blood transfusions: as above Sexually transmitted diseases: no past history Previous GYN Procedures: DNC  Last mammogram: none Date: none Last pap: normal Date: unknown OB History: G0, P0   Menstrual History: Menarche age: unknown Patient's last menstrual period was 04/16/2012.    Past Medical History  Diagnosis Date  . Hypertension   . Anxiety     situational  . Polycystic disease, ovaries   . Anemia   . Complication of anesthesia 2003    during D&C, pt states her temp spiked during surgery  . Endometrial cancer   . History of blood transfusion     Past Surgical History  Procedure Date  . Tonsillectomy   . Dilation and curettage of uterus     x 3  . Hysteroscopy w/d&c 05/14/2012    Procedure: DILATATION AND CURETTAGE /HYSTEROSCOPY;  Surgeon: Zelphia Cairo, MD;  Location: [facility];  Service: Gynecology;  Laterality: N/A;    History reviewed. No pertinent family history.  Social History:  reports that she has never smoked. She does not have any smokeless tobacco history on file. She reports that she does not drink alcohol or use illicit drugs.  Allergies: No Known Allergies  Prescriptions prior to admission  Medication Sig  Dispense Refill  . amoxicillin-clavulanate (AUGMENTIN) 500-125 MG per tablet Take 1 tablet by mouth 2 (two) times daily. Started 06/26/12 x 7 days      . ibuprofen (MOTRIN IB) 200 MG tablet Take 3 tablets (600 mg total) by mouth every 6 (six) hours as needed for pain.  30 tablet  0  . Iron-FA-B Cmp-C-Biot-Probiotic (FUSION PLUS PO) Take 1 tablet by mouth daily.      . medroxyPROGESTERone (PROVERA) 10 MG tablet Take 1 tablet (10 mg total) by mouth daily.  60 tablet  3  . metFORMIN (GLUCOPHAGE-XR) 500 MG 24 hr tablet Take 1,000 mg by mouth at bedtime.      . metoprolol-hydrochlorothiazide (LOPRESSOR HCT) 50-25 MG per tablet Take 1 tablet by mouth daily.      . Prenatal Vit-Fe Fumarate-FA (PRENATAL MULTIVITAMIN) TABS Take 1 tablet by mouth daily.        Review of Systems  Respiratory: Negative for shortness of breath.   Neurological: Negative for headaches.    Blood pressure 139/84, pulse 111, temperature 99.8 F (37.7 C), temperature source Oral, resp. rate 20, height 5\' 4"  (1.626 m), weight 100.699 kg (222 lb), last menstrual period 04/16/2012. Physical Exam  Cardiovascular: Normal rate and regular rhythm.   Respiratory: Effort normal and breath sounds normal.  GI: There is no tenderness.    No results found for this or any previous visit (from the past 24 hour(s)).  No results found.  Assessment/Plan: 31 yo with menorrhagia with atypical endometrial hyperplasia with a focus of well differentiated adenocarcinoma.  Presents for [name]. Risks reviewed with patient and husband including infection, uterine perforation, organ damage, transfusion with risk of HIV/Hep, DVT/PE, penumonia, IU synechiae and secondary infertility, laparotomy/laparoscopy, hysterectomy.  Melissa Ray II,Melissa Ray E 06/28/2012, 5:03 PM

## 2012-06-28 NOTE — Transfer of Care (Signed)
Immediate Anesthesia Transfer of Care Note  Patient: Melissa Ray  Procedure(s) Performed: Procedure(s) (LRB) with comments: DILATATION AND CURETTAGE (N/A) - SUCTION  USED  Patient Location: PACU  Anesthesia Type:General  Level of Consciousness: awake, sedated and patient cooperative  Airway & Oxygen Therapy: Patient Spontanous Breathing and Patient connected to nasal cannula oxygen  Post-op Assessment: Report given to PACU RN and Post -op Vital signs reviewed and stable  Post vital signs: Reviewed and stable  Complications: No apparent anesthesia complications

## 2012-06-29 NOTE — Op Note (Signed)
NAMENYJIA, HOLYOAK            ACCOUNT NO.:  [id number]  MEDICAL RECORD NO.:  [id number]  LOCATION:  [facility]                          [facility]:  [facility]  PHYSICIAN:  Guy Sandifer. Henderson Cloud, M.D. DATE OF BIRTH:  [birth year]  DATE OF PROCEDURE:  06/28/2012 DATE OF DISCHARGE:  06/28/2012                              OPERATIVE REPORT   PREOPERATIVE DIAGNOSES: 1. Menorrhagia. 2. Endometrial carcinoma.  POSTOPERATIVE DIAGNOSES: 1. Menorrhagia. 2. Endometrial carcinoma.  PROCEDURE:  Dilatation and curettage.  SURGEON:  Guy Sandifer. Henderson Cloud, MD  ANESTHESIA:  General with endotracheal intubation, Brayton Caves, MD  ESTIMATED BLOOD LOSS:  100 mL.  SPECIMENS:  Endometrial curettings to Pathology.  INDICATIONS AND CONSENT:  This patient is a 31 year old married white female, G0, P0 who approximately 6 weeks ago had a D and C, diagnostic of endometrial hyperplasia with a single focus of well-differentiated endometrial carcinoma.  Consultation with GYN Oncology was undertaken. Recommendation was made for placement of a Mirena IUD and subsequent endometrial biopsy.  The patient began bleeding heavily last week.  Her hemoglobin was approximately 5.  She received 2 units of packed red blood cells.  Mirena IUD was placed last week.  Today she presented to the office with heavy bleeding.  Cervix was dilated with large amount of clot and IUD presenting at the cervical os.  IUD was removed.  Because of the continued heavy bleeding, recommendation was made for dilatation and curettage for control of the bleeding.  Potential risks and complications were discussed with the patient and her husband preoperatively including but not limited to, infection, uterine perforation, organ damage, bleeding requiring transfusion of blood products with HIV and hepatitis acquisition, DVT, PE, pneumonia, laparoscopy, laparotomy, infusion, synechiae, secondary infertility, and hysterectomy.  All questions were answered and  consent was signed on the chart.  PROCEDURE:  The patient was taken to the operating room, where she was identified and placed in dorsal supine position.  General anesthesia was induced via endotracheal intubation.  She was then placed in dorsal lithotomy position.  Time-out undertaken.  She was prepped, bladder straight catheterized and draped in a sterile fashion.  Examination under anesthesia revealed retroverted uterus about 8 weeks in size. Bivalve speculum was placed.  There was about 75 mL of clot in the cervical canal, which was removed with rings and sent with the specimen. The anterior cervical lip was then injected with 1% Xylocaine and grasped with single-tooth tenaculum.  Paracervical block was placed at 2, 4, 5, 7, 8, and 10 o'clock positions with approximately 20 mL of the same solution.  Sharp curettage was then carried out for tissue and some clot.  The uterine cavity feels as though it has a heart shape in the top.  A flexible #6 suction curette and then a #7 curved curette were used also to totally evacuate the cavity of the uterus.  Good hemostasis was noted.  Observation revealed continued hemostasis.  Instruments were removed.  All counts were correct.  The patient was awakened, taken to recovery room in stable condition.     Guy Sandifer Henderson Cloud, M.D.     JET/MEDQ  D:  06/28/2012  T:  06/29/2012  Job:  [id number]

## 2012-07-01 ENCOUNTER — Encounter (HOSPITAL_COMMUNITY): Payer: Self-pay | Admitting: Obstetrics and Gynecology

## 2012-07-01 LAB — TYPE AND SCREEN

## 2012-07-18 ENCOUNTER — Ambulatory Visit: Payer: BC Managed Care – PPO | Admitting: Gynecologic Oncology

## 2012-08-12 ENCOUNTER — Other Ambulatory Visit: Payer: Self-pay | Admitting: Gynecologic Oncology

## 2012-08-12 NOTE — Progress Notes (Signed)
Order for Megace 40mg  TID called into [facility], [facility]90 with one refill per Dr. Nelly Rout.

## 2012-09-17 ENCOUNTER — Ambulatory Visit: Payer: BC Managed Care – PPO | Attending: Gynecologic Oncology | Admitting: Gynecologic Oncology

## 2012-09-17 ENCOUNTER — Encounter: Payer: Self-pay | Admitting: Gynecologic Oncology

## 2012-09-17 VITALS — BP 138/82 | HR 90 | Temp 98.4°F | Resp 18 | Wt 220.0 lb

## 2012-09-17 DIAGNOSIS — N859 Noninflammatory disorder of uterus, unspecified: Secondary | ICD-10-CM | POA: Insufficient documentation

## 2012-09-17 DIAGNOSIS — C541 Malignant neoplasm of endometrium: Secondary | ICD-10-CM

## 2012-09-17 DIAGNOSIS — E282 Polycystic ovarian syndrome: Secondary | ICD-10-CM | POA: Insufficient documentation

## 2012-09-17 DIAGNOSIS — N8500 Endometrial hyperplasia, unspecified: Secondary | ICD-10-CM | POA: Insufficient documentation

## 2012-09-17 DIAGNOSIS — I10 Essential (primary) hypertension: Secondary | ICD-10-CM | POA: Insufficient documentation

## 2012-09-17 NOTE — Patient Instructions (Addendum)
Followup in March 2013 for EMB with GYN Onc

## 2012-09-17 NOTE — Progress Notes (Signed)
Office Visit Note: Gyn-Onc   Melissa Ray 32 y.o. female  CC:  Chief Complaint  Patient presents with  . complex hyperplasia    follow-up      HPI: 32 y/o G0 with Patient with intermittent periods since 32 years old.  D&C in her 43's with a diagnosis of hyperplasia.  Trial of depo provera for 1 year with reversal of pathology on subsequent unterine evaluation.  Lost to f/u until 2008.  D&C in 2008 demonstrated hyperplasia and started on metformin and advised to lose weight.  Dx of PCO  Periods were inconsistent with episodes of menorrhagia.  Patient with passage of polyp with bleeding.  Path c/w CAH.  Patient underwent hysteroscopy and D&C.  Path CAH with focus suspicious for adenocarcinoma. She was initially placed on provera 10 mg three times daily.  INTERVAL HISTORY: A Mirena IUD was inserted in October of 2013 however it spontaneously expelled 3 days later. She received Megace in the interim and then a repeat D&C on 06/28/2012. The pathology of that evaluation was notable for focal complex atypical hyperplasia without any evidence of invasive adenocarcinoma. She received 2 units packed red blood cells. On 07/05/2012 a second IUD was placed and she started on Megace 40 mg a day for 30 days. The heavy vaginal bleeding continued and on 08/06/2012 IUD expelled again. On 08/12/2012 Megace was increased to 40 mg 3 times per day with resolution of all bleeding.  Social Hx:   History   Social History  . Marital Status: Married    Spouse Name: N/A    Number of Children: N/A  . Years of Education: N/A   Occupational History  . Not on file.   Social History Main Topics  . Smoking status: Never Smoker   . Smokeless tobacco: Not on file  . Alcohol Use: No  . Drug Use: No  . Sexually Active: Yes    Birth Control/ Protection: None   Other Topics Concern  . Not on file   Social History Narrative  . No narrative on file  Married for 6 years. Husband is present at the visit  Past  Surgical Hx:  Past Surgical History  Procedure Date  . Tonsillectomy   . Dilation and curettage of uterus     x 3  . Hysteroscopy w/d&c 05/14/2012    Procedure: DILATATION AND CURETTAGE /HYSTEROSCOPY;  Surgeon: Zelphia Cairo, MD;  Location: [facility];  Service: Gynecology;  Laterality: N/A;  . Dilation and curettage of uterus 06/28/2012    Procedure: DILATATION AND CURETTAGE;  Surgeon: Leslie Andrea, MD;  Location: [facility];  Service: Gynecology;  Laterality: N/A;  SUCTION  USED    Past Medical Hx:  Past Medical History  Diagnosis Date  . Hypertension   . Anxiety     situational  . Polycystic disease, ovaries   . Anemia   . Complication of anesthesia 2003    during D&C, pt states her temp spiked during surgery  . Endometrial cancer   . History of blood transfusion     Past Gynecological History:   G0 menarche 12, irregular menses, PCO LMP  08/06/2012  Family Hx: History reviewed. No pertinent family history.  Review of Systems:  Constitutional  Feels well, feels much better since resolution of vaginal bleeding Cardiovascular  No chest pain, mild shortness of breath with exertion, or edema  Pulmonary  No cough or wheeze.  Gastro Intestinal  No nausea, vomitting, or diarrhoea. No bright red blood per rectum,  no abdominal pain, change in bowel movement, or constipation.  Genito Urinary  No frequency, urgency, dysuria, denies vaginal bleeding or discharge denies crampy uterine pain Musculo Skeletal  No myalgia, arthralgia, joint swelling or pain  Neurologic  No weakness, numbness, change in gait,  Psychology  No depression,reports anxiety and worry, no  insomnia.   Vitals:  Blood pressure 138/82, pulse 90, temperature 98.4 F (36.9 C), temperature source Oral, resp. rate 18, weight 220 lb (99.791 kg).  Physical Exam: WD in NAD Neck  Supple NROM, without any enlargements.  Lymph Node Survey No cervical supraclavicular or inguinal adenopathy Cardiovascular  Pulse  normal rate, regularity and rhythm. S1 and S2 normal.  Lungs  Clear to auscultation bilateraly,  Psychiatry  Alert and oriented to person, place, and time  Abdomen  Normoactive bowel sounds, abdomen soft, non-tender and obese. No palpable pelvic masses. Back No CVA tenderness Genito Urinary  Vulva/vagina: Normal external female genitalia.  No lesions. Trace vaginal bleeding.  Bladder/urethra:  No lesions or masses  Vagina: Very small amount of blood in the vaginal vault.  Cervix: Normal appearing, no lesions.  Uterus: mobile, retroverted unable to assess size, no parametrial involvement   Adnexa: No palpable masses or nodularity. Rectal  Good tone, no masses no cul de sac nodularity.  Extremities  No bilateral cyanosis, clubbing or edema.   Assessment/Plan:  Melissa Ray  is a 32 y.o.  year old  with complex atypical hyperplasia suspicious for adenocarcinoma .  The patient is not yet satisfied with her parity.  A discussion was held with her and her husband in standard of care would be hysterectomy because of the 31% possibility of microinvasive disease. However in the light of the fact that she still desirous of having children the alternative recommendation was placement of a Mirena IUD. Tumor it is held in place and both have been expelled with associated heavy vaginal bleeding. With the increase in Megace the patient's been without any vaginal bleeding for over a month.    She will followup in March of 2014 at which time an endometrial biopsy will be obtained. If there is evidence of disease progression then the patient is aware that hysterectomy would be indicated since the dose of  Megace is rather high at this time. We discussed the possibility of placement of another IUD if the bleeding continued to be absent. Given her underlying diagnosis of PCO some type of management will be necessary for control of the endometrial lining indefinitely and the patient is  aware of this.       Followup in March 31 for EMB with GYN Andi Hence, Toniann Fail, MD, PhD 09/17/2012, 3:54 PM

## 2012-11-21 ENCOUNTER — Ambulatory Visit: Payer: BC Managed Care – PPO | Admitting: Lab

## 2012-11-21 ENCOUNTER — Encounter: Payer: Self-pay | Admitting: Gynecologic Oncology

## 2012-11-21 ENCOUNTER — Ambulatory Visit: Payer: BC Managed Care – PPO | Attending: Gynecologic Oncology | Admitting: Gynecologic Oncology

## 2012-11-21 VITALS — BP 136/80 | HR 64 | Temp 98.7°F | Resp 16 | Ht 66.0 in | Wt 225.8 lb

## 2012-11-21 DIAGNOSIS — N840 Polyp of corpus uteri: Secondary | ICD-10-CM | POA: Insufficient documentation

## 2012-11-21 DIAGNOSIS — N8501 Benign endometrial hyperplasia: Secondary | ICD-10-CM

## 2012-11-21 DIAGNOSIS — N8502 Endometrial intraepithelial neoplasia [EIN]: Secondary | ICD-10-CM | POA: Insufficient documentation

## 2012-11-21 NOTE — Progress Notes (Signed)
Office Visit Note: Gyn-Onc   [facility] 32 y.o. female  CC:  Chief Complaint  Patient presents with  . Complex hyperplasia    Follow up     HPI: 32 y/o G0 with Patient with intermittent periods since 32 years old.  D&C in her 75's with a diagnosis of hyperplasia.  Trial of depo provera for 1 year with reversal of pathology on subsequent unterine evaluation.  Lost to f/u until 2008.  D&C in 2008 demonstrated hyperplasia and started on metformin and advised to lose weight.  Dx of PCO  Periods were inconsistent with episodes of menorrhagia.  Patient with passage of polyp with bleeding.  Path c/w CAH.  Patient underwent hysteroscopy and D&C.  Path CAH with focus suspicious for adenocarcinoma. She was initially placed on provera 10 mg three times daily.  INTERVAL HISTORY: A Mirena IUD was inserted in October of 2013 however it spontaneously expelled 3 days later. She received Megace in the interim and then a repeat D&C on 06/28/2012. The pathology of that evaluation was notable for focal complex atypical hyperplasia without any evidence of invasive adenocarcinoma. She received 2 units packed red blood cells. On 07/05/2012 a second IUD was placed and she started on Megace 40 mg a day for 30 days. The heavy vaginal bleeding continued and on 08/06/2012 IUD expelled again. On 08/12/2012 Megace was increased to 40 mg 3 times per day with resolution of all bleeding.  Patient presents for an endometrial biopsy.  Social Hx:   History   Social History  . Marital Status: Married    Spouse Name: N/A    Number of Children: N/A  . Years of Education: N/A   Occupational History  . Not on file.   Social History Main Topics  . Smoking status: Never Smoker   . Smokeless tobacco: Not on file  . Alcohol Use: No  . Drug Use: No  . Sexually Active: Yes    Birth Control/ Protection: None   Other Topics Concern  . Not on file   Social History Narrative  . No narrative on file  Married for 6 years.  Husband is present at the visit  Past Surgical Hx:  Past Surgical History  Procedure Laterality Date  . Tonsillectomy    . Dilation and curettage of uterus      x 3  . Hysteroscopy w/d&c  05/14/2012    Procedure: DILATATION AND CURETTAGE /HYSTEROSCOPY;  Surgeon: Zelphia Cairo, MD;  Location: [facility];  Service: Gynecology;  Laterality: N/A;  . Dilation and curettage of uterus  06/28/2012    Procedure: DILATATION AND CURETTAGE;  Surgeon: Leslie Andrea, MD;  Location: [facility];  Service: Gynecology;  Laterality: N/A;  SUCTION  USED    Past Medical Hx:  Past Medical History  Diagnosis Date  . Hypertension   . Anxiety     situational  . Polycystic disease, ovaries   . Anemia   . Complication of anesthesia 2003    during D&C, pt states her temp spiked during surgery  . Endometrial cancer   . History of blood transfusion     Past Gynecological History:   G0 menarche 12, irregular menses, PCO LMP  08/06/2012  Family Hx:  Family History  Problem Relation Age of Onset  . Heart disease Mother   . Diabetes Mother   . Heart disease Father   . Diabetes Father   . Cancer Father   . Polycystic ovary syndrome Sister  Review of Systems: Constitutional  Feels well, feels much better since resolution of vaginal bleeding Gastro Intestinal  No nausea, vomitting, or diarrhoea. No bright red blood per rectum, no abdominal pain, change in bowel movement, or constipation.  Genito Urinary  No frequency, urgency, dysuria, denies vaginal bleeding or discharge denies crampy uterine pain no  insomnia.   Vitals:  Blood pressure 136/80, pulse 64, temperature 98.7 F (37.1 C), temperature source Oral, resp. rate 16, height 5\' 6"  (1.676 m), weight 225 lb 12.8 oz (102.422 kg), last menstrual period 08/19/2012.  Physical Exam: WD in NAD Pelvic:  Nl EGBUS, no blood or discharge in the vaginal vault.   Endometrial Biopsy  The procedure was explained.  The speculum was placed and the cervix  was prepped with Betadine.  The endometrial biopsy was performed with 3 passes of the endometrial biopsy pipelle. Adequate tissue was obtained as was a polyp. The uterus sounded to 8 cm.  The patient tolerated procedure   Assessment/Plan:  Ms. Melissa Ray  is a 31 y.o.  year old  with complex atypical hyperplasia suspicious for adenocarcinoma .  The patient is not yet satisfied with her parity.  An EMB was collected today.  Will discuss plan based upon the results.   Laurette Schimke, MD, PhD 11/25/2012, 10:13 AM

## 2012-11-22 LAB — PREGNANCY, URINE: Preg Test, Ur: NEGATIVE

## 2012-11-25 ENCOUNTER — Encounter: Payer: Self-pay | Admitting: Gynecologic Oncology

## 2012-11-25 NOTE — Patient Instructions (Signed)
Endometrial Biopsy This is a test in which a tissue sample (a biopsy) is taken from inside the uterus (womb). It is then looked at by a specialist under a microscope to see if the tissue is normal or abnormal. The endometrium is the lining of the uterus. This test helps determine where you are in your menstrual cycle and how hormone levels are affecting the lining of the uterus. Another use for this test is to diagnose endometrial cancer, tuberculosis, polyps, or inflammatory conditions and to evaluate uterine bleeding. PREPARATION FOR TEST No preparation or fasting is necessary. NORMAL FINDINGS No pathologic conditions. Presence of "secretory-type" endometrium 3 to 5 days before to normal menstruation. Ranges for normal findings may vary among different laboratories and hospitals. You should always check with your doctor after having lab work or other tests done to discuss the meaning of your test results and whether your values are considered within normal limits. MEANING OF TEST  Your caregiver will go over the test results with you and discuss the importance and meaning of your results, as well as treatment options and the need for additional tests if necessary. OBTAINING THE TEST RESULTS It is your responsibility to obtain your test results. Ask the lab or department performing the test when and how you will get your results. Document Released: 12/15/2004 Document Revised: 11/06/2011 Document Reviewed: 07/24/2008 [facility] Patient Information 2013 [place], [place].   Will call with results and discuss the treatment plan.   Thank you very much Ms. Yizel Canby for allowing me to provide care for you today.  I appreciate your confidence in choosing our Gynecologic Oncology team.  If you have any questions about your visit today please call our office and we will get back to you as soon as possible.  Maryclare Labrador. Fleur Audino MD., PhD Gynecologic Oncology

## 2012-11-26 ENCOUNTER — Telehealth: Payer: Self-pay | Admitting: Gynecologic Oncology

## 2012-11-26 NOTE — Telephone Encounter (Signed)
Message left that I was calling to inform her of the biopsy results.Patient asked to call.

## 2012-11-26 NOTE — Telephone Encounter (Signed)
error 

## 2012-11-29 ENCOUNTER — Encounter: Payer: Self-pay | Admitting: Gynecologic Oncology

## 2012-11-29 NOTE — Progress Notes (Signed)
Dr. Nelly Rout notified the patient of her endometrial biopsy on April 1.  The options she presented to her were: 1.Mirena IUD 2.Depo provera 3.OCP.  She asked her to call her referring gynecologist regarding her preference. She recommended treatment until she is ready to become pregnant and asked Ms. Babe to stay on Megace and metformin until she saw her referring physician.

## 2013-02-11 ENCOUNTER — Ambulatory Visit: Payer: BC Managed Care – PPO | Admitting: Gynecologic Oncology

## 2013-04-02 ENCOUNTER — Other Ambulatory Visit (HOSPITAL_COMMUNITY): Payer: Self-pay | Admitting: Gynecology

## 2013-04-02 DIAGNOSIS — Z3141 Encounter for fertility testing: Secondary | ICD-10-CM

## 2013-04-08 ENCOUNTER — Ambulatory Visit (HOSPITAL_COMMUNITY)
Admission: RE | Admit: 2013-04-08 | Discharge: 2013-04-08 | Disposition: A | Discharge status: Home / Self Care | Payer: BC Managed Care – PPO | Source: Ambulatory Visit | Attending: Gynecology | Admitting: Gynecology

## 2013-04-08 DIAGNOSIS — Z3141 Encounter for fertility testing: Secondary | ICD-10-CM

## 2013-04-08 DIAGNOSIS — N979 Female infertility, unspecified: Secondary | ICD-10-CM | POA: Insufficient documentation

## 2013-04-08 MED ORDER — IOHEXOL 300 MG/ML  SOLN
20.0000 mL | Freq: Once | INTRAMUSCULAR | Status: AC | PRN
  Administered 2013-04-08: 20 mL

## 2013-04-29 ENCOUNTER — Encounter (HOSPITAL_COMMUNITY): Payer: Self-pay | Admitting: Pharmacist

## 2013-05-01 ENCOUNTER — Encounter (HOSPITAL_COMMUNITY): Payer: Self-pay

## 2013-05-01 ENCOUNTER — Encounter (HOSPITAL_COMMUNITY)
Admission: RE | Admit: 2013-05-01 | Discharge: 2013-05-01 | Disposition: A | Discharge status: Home / Self Care | Payer: BC Managed Care – PPO | Source: Ambulatory Visit | Attending: Obstetrics and Gynecology | Admitting: Obstetrics and Gynecology

## 2013-05-01 DIAGNOSIS — Z01812 Encounter for preprocedural laboratory examination: Secondary | ICD-10-CM | POA: Insufficient documentation

## 2013-05-01 DIAGNOSIS — Z01818 Encounter for other preprocedural examination: Secondary | ICD-10-CM | POA: Insufficient documentation

## 2013-05-01 LAB — CBC
HCT: 36 % (ref 36.0–46.0)
Hemoglobin: 11.7 g/dL — ABNORMAL LOW (ref 12.0–15.0)
MCHC: 32.5 g/dL (ref 30.0–36.0)
RBC: 4.83 MIL/uL (ref 3.87–5.11)
WBC: 12 10*3/uL — ABNORMAL HIGH (ref 4.0–10.5)

## 2013-05-01 LAB — BASIC METABOLIC PANEL
BUN: 13 mg/dL (ref 6–23)
CO2: 28 mEq/L (ref 19–32)
Chloride: 101 mEq/L (ref 96–112)
Glucose, Bld: 93 mg/dL (ref 70–99)
Potassium: 3.1 mEq/L — ABNORMAL LOW (ref 3.5–5.1)
Sodium: 138 mEq/L (ref 135–145)

## 2013-05-01 NOTE — Patient Instructions (Signed)
Your procedure is scheduled on:05/06/13  Enter through the Main Entrance at :1130 am Pick up desk phone and dial [phone] and inform us of your arrival.  Please call [phone] if you have any problems the morning of surgery.  Remember: Do not eat food after midnight:Monday Clear liquids are ok until:9am Tuesday   You may brush your teeth the morning of surgery.  Take these meds the morning of surgery with a sip of water: BP pill  DO NOT wear jewelry, eye make-up, lipstick,body lotion, or dark fingernail polish.  (Polished toes are ok) You may wear deodorant.  If you are to be admitted after surgery, leave suitcase in car until your room has been assigned. Patients discharged on the day of surgery will not be allowed to drive home. Wear loose fitting, comfortable clothes for your ride home.

## 2013-05-02 NOTE — Pre-Procedure Instructions (Signed)
I left msg on pt's VM telling her about K+ being low and foods she can eat to help maintain or increase her level prior tp surgery.

## 2013-05-05 NOTE — H&P (Addendum)
32 yo with h/o endometrial hyperplasia with focus of endometrial adenocarcinoma has recurrent endometrial polyps.  Presents for [facility], desires fertility  PMHx;  PCOS, HTN, endometrial hyperplasia PSHx:  D&C x 3 All:  NKA Meds:  Metformin, metoprolol/HCTZ SHX;  Negative x 3  AF, VSS Gen - NAD CV - RRR Lungs - clear Abd - soft, obese, NT Ext - NT  HSG - irregulr cavity at fundus suggestive of polyps  A/P:  Endometrial poyps with h/o endometrial hyperplasia w/ focus of endometrial adenocarcinoma Hysteroscopy, D&C R/b/a discussed.  Informed consent. LMP in early august, UPT ordered.

## 2013-05-06 ENCOUNTER — Ambulatory Visit (HOSPITAL_COMMUNITY): Payer: BC Managed Care – PPO | Admitting: Certified Registered Nurse Anesthetist

## 2013-05-06 ENCOUNTER — Encounter (HOSPITAL_COMMUNITY)
Admission: RE | Disposition: A | Discharge status: Home / Self Care | Payer: Self-pay | Source: Ambulatory Visit | Attending: Obstetrics and Gynecology

## 2013-05-06 ENCOUNTER — Ambulatory Visit (HOSPITAL_COMMUNITY)
Admission: RE | Admit: 2013-05-06 | Discharge: 2013-05-06 | Disposition: A | Discharge status: Home / Self Care | Payer: BC Managed Care – PPO | Source: Ambulatory Visit | Attending: Obstetrics and Gynecology | Admitting: Obstetrics and Gynecology

## 2013-05-06 ENCOUNTER — Encounter (HOSPITAL_COMMUNITY): Payer: Self-pay | Admitting: Certified Registered"

## 2013-05-06 ENCOUNTER — Encounter (HOSPITAL_COMMUNITY): Payer: Self-pay | Admitting: Certified Registered Nurse Anesthetist

## 2013-05-06 DIAGNOSIS — N8501 Benign endometrial hyperplasia: Secondary | ICD-10-CM | POA: Insufficient documentation

## 2013-05-06 DIAGNOSIS — I10 Essential (primary) hypertension: Secondary | ICD-10-CM | POA: Insufficient documentation

## 2013-05-06 DIAGNOSIS — N840 Polyp of corpus uteri: Secondary | ICD-10-CM | POA: Insufficient documentation

## 2013-05-06 HISTORY — PX: HYSTEROSCOPY WITH D & C: SHX1775

## 2013-05-06 SURGERY — DILATATION AND CURETTAGE /HYSTEROSCOPY
Anesthesia: General | Site: Uterus | Wound class: Clean Contaminated

## 2013-05-06 MED ORDER — LIDOCAINE HCL (CARDIAC) 20 MG/ML IV SOLN
INTRAVENOUS | Status: DC | PRN
  Administered 2013-05-06: 80 mg via INTRAVENOUS

## 2013-05-06 MED ORDER — ONDANSETRON HCL 4 MG/2ML IJ SOLN
INTRAMUSCULAR | Status: DC | PRN
  Administered 2013-05-06: 4 mg via INTRAVENOUS

## 2013-05-06 MED ORDER — GLYCINE 1.5 % IR SOLN
Status: DC | PRN
  Administered 2013-05-06: 3000 mL

## 2013-05-06 MED ORDER — FENTANYL CITRATE 0.05 MG/ML IJ SOLN
INTRAMUSCULAR | Status: DC | PRN
  Administered 2013-05-06 (×2): 50 ug via INTRAVENOUS

## 2013-05-06 MED ORDER — ONDANSETRON HCL 4 MG/2ML IJ SOLN
INTRAMUSCULAR | Status: AC
  Filled 2013-05-06: qty 2

## 2013-05-06 MED ORDER — HYDROMORPHONE HCL PF 1 MG/ML IJ SOLN
INTRAMUSCULAR | Status: DC | PRN
  Administered 2013-05-06: 1 mg via INTRAVENOUS

## 2013-05-06 MED ORDER — LIDOCAINE HCL (CARDIAC) 20 MG/ML IV SOLN
INTRAVENOUS | Status: AC
  Filled 2013-05-06: qty 5

## 2013-05-06 MED ORDER — MIDAZOLAM HCL 2 MG/2ML IJ SOLN
INTRAMUSCULAR | Status: DC | PRN
  Administered 2013-05-06: 2 mg via INTRAVENOUS

## 2013-05-06 MED ORDER — PROPOFOL 10 MG/ML IV BOLUS
INTRAVENOUS | Status: DC | PRN
  Administered 2013-05-06: 180 mg via INTRAVENOUS
  Administered 2013-05-06: 20 mg via INTRAVENOUS

## 2013-05-06 MED ORDER — PROPOFOL 10 MG/ML IV EMUL
INTRAVENOUS | Status: AC
  Filled 2013-05-06: qty 20

## 2013-05-06 MED ORDER — FENTANYL CITRATE 0.05 MG/ML IJ SOLN
INTRAMUSCULAR | Status: AC
  Filled 2013-05-06: qty 2

## 2013-05-06 MED ORDER — HYDROMORPHONE HCL PF 1 MG/ML IJ SOLN
INTRAMUSCULAR | Status: AC
  Filled 2013-05-06: qty 1

## 2013-05-06 MED ORDER — LACTATED RINGERS IV SOLN
INTRAVENOUS | Status: DC
  Administered 2013-05-06 (×2): via INTRAVENOUS

## 2013-05-06 MED ORDER — LIDOCAINE HCL 1 % IJ SOLN
INTRAMUSCULAR | Status: DC | PRN
  Administered 2013-05-06: 20 mL

## 2013-05-06 MED ORDER — MIDAZOLAM HCL 2 MG/2ML IJ SOLN
INTRAMUSCULAR | Status: AC
  Filled 2013-05-06: qty 2

## 2013-05-06 MED ORDER — DEXAMETHASONE SODIUM PHOSPHATE 10 MG/ML IJ SOLN
INTRAMUSCULAR | Status: AC
  Filled 2013-05-06: qty 1

## 2013-05-06 MED ORDER — DEXAMETHASONE SODIUM PHOSPHATE 10 MG/ML IJ SOLN
INTRAMUSCULAR | Status: DC | PRN
  Administered 2013-05-06: 10 mg via INTRAVENOUS

## 2013-05-06 MED ORDER — DEXTROSE 5 % IV SOLN
2.0000 g | INTRAVENOUS | Status: AC
  Administered 2013-05-06: 2 g via INTRAVENOUS
  Filled 2013-05-06: qty 2

## 2013-05-06 MED ORDER — FENTANYL CITRATE 0.05 MG/ML IJ SOLN: 25.0000 ug | INTRAMUSCULAR | Status: DC | PRN

## 2013-05-06 MED ORDER — KETOROLAC TROMETHAMINE 30 MG/ML IJ SOLN
INTRAMUSCULAR | Status: DC | PRN
  Administered 2013-05-06: 30 mg via INTRAVENOUS

## 2013-05-06 SURGICAL SUPPLY — 18 items
ABLATOR ENDOMETRIAL BIPOLAR (ABLATOR) IMPLANT
CANISTER SUCTION 2500CC (MISCELLANEOUS) ×2 IMPLANT
CATH ROBINSON RED A/P 16FR (CATHETERS) ×2 IMPLANT
CATH THERMACHOICE III (CATHETERS) IMPLANT
CLOTH BEACON ORANGE TIMEOUT ST (SAFETY) ×2 IMPLANT
CONTAINER PREFILL 10% NBF 60ML (FORM) ×4 IMPLANT
DRESSING TELFA 8X3 (GAUZE/BANDAGES/DRESSINGS) ×2 IMPLANT
ELECT REM PT RETURN 9FT ADLT (ELECTROSURGICAL) ×2
ELECTRODE REM PT RTRN 9FT ADLT (ELECTROSURGICAL) ×1 IMPLANT
GLOVE BIO SURGEON STRL SZ 6.5 (GLOVE) ×2 IMPLANT
GLOVE BIOGEL PI IND STRL 7.0 (GLOVE) ×1 IMPLANT
GLOVE BIOGEL PI INDICATOR 7.0 (GLOVE) ×1
GOWN STRL REIN XL XLG (GOWN DISPOSABLE) ×4 IMPLANT
LOOP ANGLED CUTTING 22FR (CUTTING LOOP) IMPLANT
PACK HYSTEROSCOPY LF (CUSTOM PROCEDURE TRAY) ×2 IMPLANT
PAD OB MATERNITY 4.3X12.25 (PERSONAL CARE ITEMS) ×2 IMPLANT
TOWEL OR 17X24 6PK STRL BLUE (TOWEL DISPOSABLE) ×4 IMPLANT
WATER STERILE IRR 1000ML POUR (IV SOLUTION) ×2 IMPLANT

## 2013-05-06 NOTE — Anesthesia Postprocedure Evaluation (Signed)
  Anesthesia Post Note  Patient: Melissa Ray  Procedure(s) Performed: Procedure(s) (LRB): DILATATION AND CURETTAGE /HYSTEROSCOPY (N/A)  Anesthesia type: GA  Patient location: PACU  Post pain: Pain level controlled  Post assessment: Post-op Vital signs reviewed  Last Vitals:  Filed Vitals:   05/06/13 1445  BP:   Pulse:   Temp:   Resp: 16    Post vital signs: Reviewed  Level of consciousness: sedated  Complications: No apparent anesthesia complications

## 2013-05-06 NOTE — Transfer of Care (Signed)
Immediate Anesthesia Transfer of Care Note  Patient: Melissa Ray  Procedure(s) Performed: Procedure(s): DILATATION AND CURETTAGE /HYSTEROSCOPY (N/A)  Patient Location: PACU  Anesthesia Type:General  Level of Consciousness: awake, alert  and oriented  Airway & Oxygen Therapy: Patient Spontanous Breathing and Patient connected to nasal cannula oxygen  Post-op Assessment: Report given to PACU RN, Post -op Vital signs reviewed and stable and Patient moving all extremities  Post vital signs: Reviewed and stable  Complications: No apparent anesthesia complications

## 2013-05-06 NOTE — Anesthesia Preprocedure Evaluation (Signed)
Anesthesia Evaluation  Patient identified by MRN, date of birth, ID band Patient awake    Reviewed: Allergy & Precautions, H&P , Patient's Chart, lab work & pertinent test results, reviewed documented beta blocker date and time   Airway Mallampati: II TM Distance: >3 FB Neck ROM: full    Dental no notable dental hx.    Pulmonary  breath sounds clear to auscultation  Pulmonary exam normal       Cardiovascular hypertension, On Medications Rhythm:regular Rate:Normal     Neuro/Psych    GI/Hepatic   Endo/Other  Morbid obesity  Renal/GU      Musculoskeletal   Abdominal   Peds  Hematology   Anesthesia Other Findings     Hypertension       Anemia           History of blood transfusion      Hx of fever after GA for D&C Multiple GA since      Reproductive/Obstetrics                           Anesthesia Physical Anesthesia Plan  ASA: III  Anesthesia Plan:    Post-op Pain Management:    Induction: Intravenous  Airway Management Planned: LMA  Additional Equipment:   Intra-op Plan:   Post-operative Plan:   Informed Consent: I have reviewed the patients History and Physical, chart, labs and discussed the procedure including the risks, benefits and alternatives for the proposed anesthesia with the patient or authorized representative who has indicated his/her understanding and acceptance.   Dental Advisory Given and Dental advisory given  Plan Discussed with: CRNA and Surgeon  Anesthesia Plan Comments:         Anesthesia Quick Evaluation

## 2013-05-06 NOTE — Preoperative (Signed)
Beta Blockers   Reason not to administer Beta Blockers:B-blocker taken 05/06/13 @0800 

## 2013-05-07 ENCOUNTER — Encounter (HOSPITAL_COMMUNITY): Payer: Self-pay | Admitting: Obstetrics and Gynecology

## 2013-05-12 NOTE — Op Note (Signed)
NAME:  Melissa Ray, Melissa Ray                 ACCOUNT NO.:  MEDICAL RECORD NO.:  [id number]  LOCATION:                                 FACILITY:  PHYSICIAN:  Zelphia Cairo, MD    DATE OF BIRTH:  19-May-1981  DATE OF PROCEDURE:  05/07/2013 DATE OF DISCHARGE:                              OPERATIVE REPORT   PREOPERATIVE DIAGNOSES: 1. Endometrial polyp. 2. History of endometrial hyperplasia with small focus of endometrial     carcinoma.  POSTOPERATIVE DIAGNOSES: 1. Endometrial polyp. 2. History of endometrial hyperplasia with small focus of endometrial     carcinoma, pathology pending.  PROCEDURE: 1. Cervical block. 2. Hysteroscopy. 3. Dilation and curettage.  SURGEON:  Zelphia Cairo, MD  ANESTHESIA:  General.  COMPLICATIONS:  None.  SPECIMEN:  Endometrial curettings with polyp.  DESCRIPTION OF PROCEDURE:  The patient was taken to the operating room after informed consent was obtained.  She was given anesthesia and placed in the dorsal lithotomy position using Allen stirrups.  She was prepped and draped in sterile fashion.  An in and out catheter was used to drain her bladder for an unmeasured amount urine.  The bivalve speculum was placed in the cervix and a single-tooth tenaculum on the anterior lip of the cervix.  An 1% lidocaine was used to perform a cervical block.  The cervix was then easily dilated with Shawnie Pons dilators and the diagnostic hysteroscope was inserted.  An irregular polypoid- appearing endometrial lining was identified.  Bilateral ostia could be seen.  The diagnostic hysteroscope was removed and a gentle curetting was performed using a curette.  It was noted during the procedure that the endometrium felt firm or calcified during the curette procedure. Diagnostic hysteroscope was reinserted.  Small polypoid masses noted on the left uterine sidewall.  A targeted curette was then performed at this site.  Hysteroscope reinserted.  No other masses were seen.   The hysteroscope and tenaculum were removed.  The patient tolerated the procedure well.  She was extubated and taken to the recovery room in stable condition. Specimen was placed on Telfa and passed off to be sent to pathology. Sponge, lap, needle, and instrument counts were correct x2.     Zelphia Cairo, MD     GA/MEDQ  D:  05/09/2013  T:  05/10/2013  Job:  [id number]

## 2013-10-29 ENCOUNTER — Other Ambulatory Visit: Payer: Self-pay | Admitting: Obstetrics and Gynecology

## 2013-11-19 ENCOUNTER — Encounter (HOSPITAL_BASED_OUTPATIENT_CLINIC_OR_DEPARTMENT_OTHER): Payer: Self-pay | Admitting: *Deleted

## 2013-11-20 ENCOUNTER — Encounter (HOSPITAL_BASED_OUTPATIENT_CLINIC_OR_DEPARTMENT_OTHER): Payer: Self-pay | Admitting: *Deleted

## 2013-11-24 ENCOUNTER — Encounter (HOSPITAL_BASED_OUTPATIENT_CLINIC_OR_DEPARTMENT_OTHER): Payer: Self-pay | Admitting: *Deleted

## 2013-11-24 NOTE — H&P (Addendum)
                  33 yo with h/o endometrial hyperplasia with focus of endometrial adenocarcinoma presents for management because unable to obtain adequate specimen in office.  Presents for [facility], desires to maintain fertility   PMHx; PCOS, HTN, endometrial hyperplasia with focus of adenoca PSHx: D&C x 4  All: NKA  Meds: Metformin, metoprolol/HCTZ,megace SHX; Negative x 3   AF, VSS  Gen - NAD  CV - RRR  Lungs - clear  Abd - soft, obese, NT  Ext - NT   [place] -  No uterine masses, endometrial thickness 66mm  A/P: Endometrial poyps with h/o endometrial hyperplasia w/ focus of endometrial adenocarcinoma  Hysteroscopy, D&C   R/b/a discussed, questions answered, informed consent.

## 2013-11-24 NOTE — Progress Notes (Signed)
NPO AFTER MN. ARRIVE AT 0530. NEEDS CBC, BMET, AND URINE PREG. (PT STATES UNABLE TO GET OFF WORK PRIOR TO DOS).

## 2013-11-27 ENCOUNTER — Encounter (HOSPITAL_BASED_OUTPATIENT_CLINIC_OR_DEPARTMENT_OTHER)
Admission: RE | Disposition: A | Discharge status: Home / Self Care | Payer: Self-pay | Source: Ambulatory Visit | Attending: Obstetrics and Gynecology

## 2013-11-27 ENCOUNTER — Ambulatory Visit (HOSPITAL_BASED_OUTPATIENT_CLINIC_OR_DEPARTMENT_OTHER): Payer: BC Managed Care – PPO | Admitting: Anesthesiology

## 2013-11-27 ENCOUNTER — Encounter (HOSPITAL_BASED_OUTPATIENT_CLINIC_OR_DEPARTMENT_OTHER): Payer: BC Managed Care – PPO | Admitting: Anesthesiology

## 2013-11-27 ENCOUNTER — Ambulatory Visit (HOSPITAL_BASED_OUTPATIENT_CLINIC_OR_DEPARTMENT_OTHER)
Admission: RE | Admit: 2013-11-27 | Discharge: 2013-11-27 | Disposition: A | Discharge status: Home / Self Care | Payer: BC Managed Care – PPO | Source: Ambulatory Visit | Attending: Obstetrics and Gynecology | Admitting: Obstetrics and Gynecology

## 2013-11-27 ENCOUNTER — Encounter (HOSPITAL_BASED_OUTPATIENT_CLINIC_OR_DEPARTMENT_OTHER): Payer: Self-pay | Admitting: *Deleted

## 2013-11-27 DIAGNOSIS — Z8542 Personal history of malignant neoplasm of other parts of uterus: Secondary | ICD-10-CM | POA: Insufficient documentation

## 2013-11-27 DIAGNOSIS — N8501 Benign endometrial hyperplasia: Secondary | ICD-10-CM | POA: Insufficient documentation

## 2013-11-27 DIAGNOSIS — E119 Type 2 diabetes mellitus without complications: Secondary | ICD-10-CM | POA: Insufficient documentation

## 2013-11-27 DIAGNOSIS — I10 Essential (primary) hypertension: Secondary | ICD-10-CM | POA: Insufficient documentation

## 2013-11-27 DIAGNOSIS — D649 Anemia, unspecified: Secondary | ICD-10-CM | POA: Insufficient documentation

## 2013-11-27 DIAGNOSIS — N840 Polyp of corpus uteri: Secondary | ICD-10-CM | POA: Insufficient documentation

## 2013-11-27 HISTORY — PX: HYSTEROSCOPY WITH D & C: SHX1775

## 2013-11-27 HISTORY — DX: Benign endometrial hyperplasia: N85.01

## 2013-11-27 HISTORY — DX: Prediabetes: R73.03

## 2013-11-27 HISTORY — DX: Excessive and frequent menstruation with irregular cycle: N92.1

## 2013-11-27 HISTORY — DX: Presence of spectacles and contact lenses: Z97.3

## 2013-11-27 LAB — CBC
HCT: 37.2 % (ref 36.0–46.0)
Hemoglobin: 12.2 g/dL (ref 12.0–15.0)
MCH: 24.8 pg — AB (ref 26.0–34.0)
MCHC: 32.8 g/dL (ref 30.0–36.0)
MCV: 75.8 fL — AB (ref 78.0–100.0)
PLATELETS: 371 10*3/uL (ref 150–400)
RBC: 4.91 MIL/uL (ref 3.87–5.11)
RDW: 14.3 % (ref 11.5–15.5)
WBC: 10.9 10*3/uL — AB (ref 4.0–10.5)

## 2013-11-27 LAB — BASIC METABOLIC PANEL
BUN: 9 mg/dL (ref 6–23)
CALCIUM: 9.8 mg/dL (ref 8.4–10.5)
CO2: 23 mEq/L (ref 19–32)
CREATININE: 0.52 mg/dL (ref 0.50–1.10)
Chloride: 102 mEq/L (ref 96–112)
GFR calc non Af Amer: >90 mL/min (ref >90)
Glucose, Bld: 118 mg/dL — ABNORMAL HIGH (ref 70–99)
Potassium: 3.5 mEq/L — ABNORMAL LOW (ref 3.7–5.3)
SODIUM: 139 meq/L (ref 137–147)

## 2013-11-27 SURGERY — DILATATION AND CURETTAGE /HYSTEROSCOPY
Anesthesia: General | Site: Uterus

## 2013-11-27 MED ORDER — PROPOFOL 10 MG/ML IV BOLUS
INTRAVENOUS | Status: DC | PRN
  Administered 2013-11-27: 200 mg via INTRAVENOUS

## 2013-11-27 MED ORDER — MIDAZOLAM HCL 5 MG/5ML IJ SOLN
INTRAMUSCULAR | Status: DC | PRN
  Administered 2013-11-27: 2 mg via INTRAVENOUS

## 2013-11-27 MED ORDER — KETOROLAC TROMETHAMINE 30 MG/ML IJ SOLN
INTRAMUSCULAR | Status: DC | PRN
  Administered 2013-11-27: 30 mg via INTRAVENOUS

## 2013-11-27 MED ORDER — DEXAMETHASONE SODIUM PHOSPHATE 4 MG/ML IJ SOLN
INTRAMUSCULAR | Status: DC | PRN
  Administered 2013-11-27: 10 mg via INTRAVENOUS

## 2013-11-27 MED ORDER — LIDOCAINE HCL (CARDIAC) 20 MG/ML IV SOLN
INTRAVENOUS | Status: DC | PRN
  Administered 2013-11-27: 100 mg via INTRAVENOUS

## 2013-11-27 MED ORDER — DEXTROSE 5 % IV SOLN
2.0000 g | INTRAVENOUS | Status: AC
  Administered 2013-11-27: 2 g via INTRAVENOUS
  Filled 2013-11-27: qty 2

## 2013-11-27 MED ORDER — FENTANYL CITRATE 0.05 MG/ML IJ SOLN
25.0000 ug | INTRAMUSCULAR | Status: DC | PRN
  Filled 2013-11-27: qty 1

## 2013-11-27 MED ORDER — FENTANYL CITRATE 0.05 MG/ML IJ SOLN
INTRAMUSCULAR | Status: AC
  Filled 2013-11-27: qty 4

## 2013-11-27 MED ORDER — LACTATED RINGERS IV SOLN
INTRAVENOUS | Status: DC
Start: 2013-11-27 — End: 2013-11-27
  Administered 2013-11-27 (×2): via INTRAVENOUS
  Filled 2013-11-27: qty 1000

## 2013-11-27 MED ORDER — ONDANSETRON HCL 4 MG/2ML IJ SOLN
INTRAMUSCULAR | Status: DC | PRN
  Administered 2013-11-27: 4 mg via INTRAVENOUS

## 2013-11-27 MED ORDER — LIDOCAINE HCL 1 % IJ SOLN
INTRAMUSCULAR | Status: DC | PRN
  Administered 2013-11-27: 10 mL

## 2013-11-27 MED ORDER — MIDAZOLAM HCL 2 MG/2ML IJ SOLN
INTRAMUSCULAR | Status: AC
  Filled 2013-11-27: qty 2

## 2013-11-27 MED ORDER — PROMETHAZINE HCL 25 MG/ML IJ SOLN
6.2500 mg | INTRAMUSCULAR | Status: DC | PRN
  Filled 2013-11-27: qty 1

## 2013-11-27 MED ORDER — FENTANYL CITRATE 0.05 MG/ML IJ SOLN
INTRAMUSCULAR | Status: DC | PRN
Start: 2013-11-27 — End: 2013-11-27
  Administered 2013-11-27 (×4): 25 ug via INTRAVENOUS
  Administered 2013-11-27 (×2): 50 ug via INTRAVENOUS

## 2013-11-27 SURGICAL SUPPLY — 38 items
CANISTER SUCTION 2500CC (MISCELLANEOUS) ×3 IMPLANT
CATH ROBINSON RED A/P 16FR (CATHETERS) ×2 IMPLANT
CLOTH BEACON ORANGE TIMEOUT ST (SAFETY) ×3 IMPLANT
CORD ACTIVE DISPOSABLE (ELECTRODE)
CORD ELECTRO ACTIVE DISP (ELECTRODE) ×1 IMPLANT
COVER TABLE BACK 60X90 (DRAPES) ×3 IMPLANT
DRAPE CAMERA CLOSED 9X96 (DRAPES) ×3 IMPLANT
DRAPE LG THREE QUARTER DISP (DRAPES) ×3 IMPLANT
DRESSING TELFA 8X3 (GAUZE/BANDAGES/DRESSINGS) ×3 IMPLANT
ELECT LOOP GYNE PRO 24FR (CUTTING LOOP)
ELECT REM PT RETURN 9FT ADLT (ELECTROSURGICAL) ×3
ELECT VAPORTRODE GRVD BAR (ELECTRODE) IMPLANT
ELECTRODE LOOP GYNE PRO 24FR (CUTTING LOOP) ×1 IMPLANT
ELECTRODE REM PT RTRN 9FT ADLT (ELECTROSURGICAL) ×1 IMPLANT
ELECTRODE ROLLER BARREL 22FR (ELECTROSURGICAL) IMPLANT
ELECTRODE VAPORCUT 22FR (ELECTROSURGICAL) IMPLANT
GLOVE BIO SURGEON STRL SZ 6.5 (GLOVE) ×2 IMPLANT
GLOVE BIO SURGEONS STRL SZ 6.5 (GLOVE) ×1
GLOVE BIOGEL PI IND STRL 7.0 (GLOVE) ×2 IMPLANT
GLOVE BIOGEL PI IND STRL 7.5 (GLOVE) IMPLANT
GLOVE BIOGEL PI INDICATOR 7.0 (GLOVE) ×4
GLOVE BIOGEL PI INDICATOR 7.5 (GLOVE) ×2
GLOVE INDICATOR 7.5 STRL GRN (GLOVE) ×2 IMPLANT
GLYCINE 1.5% IRRIG UROMATIC (IV SOLUTION) ×3 IMPLANT
GOWN STRL REUS W/ TWL LRG LVL4 (GOWN DISPOSABLE) IMPLANT
GOWN STRL REUS W/TWL LRG LVL4 (GOWN DISPOSABLE) ×6
LEGGING LITHOTOMY PAIR STRL (DRAPES) ×3 IMPLANT
LOOP ANGLED CUTTING 22FR (CUTTING LOOP) IMPLANT
NDL SPNL 22GX3.5 QUINCKE BK (NEEDLE) ×1 IMPLANT
NEEDLE SPNL 22GX3.5 QUINCKE BK (NEEDLE) ×3 IMPLANT
PACK BASIN DAY SURGERY FS (CUSTOM PROCEDURE TRAY) ×3 IMPLANT
PAD OB MATERNITY 4.3X12.25 (PERSONAL CARE ITEMS) ×3 IMPLANT
PAD PREP 24X48 CUFFED NSTRL (MISCELLANEOUS) ×3 IMPLANT
SYR CONTROL 10ML LL (SYRINGE) ×3 IMPLANT
TOWEL OR 17X24 6PK STRL BLUE (TOWEL DISPOSABLE) ×6 IMPLANT
TRAY DSU PREP LF (CUSTOM PROCEDURE TRAY) ×3 IMPLANT
TUBING HYDROFLEX HYSTEROSCOPY (TUBING) ×3 IMPLANT
WATER STERILE IRR 500ML POUR (IV SOLUTION) ×3 IMPLANT

## 2013-11-27 NOTE — Anesthesia Procedure Notes (Signed)
Procedure Name: LMA Insertion Date/Time: 11/27/2013 7:32 AM Performed by: Mechele Claude Pre-anesthesia Checklist: Patient identified, Emergency Drugs available, Suction available and Patient being monitored Patient Re-evaluated:Patient Re-evaluated prior to inductionOxygen Delivery Method: Circle System Utilized Preoxygenation: Pre-oxygenation with 100% oxygen Intubation Type: IV induction Ventilation: Mask ventilation without difficulty LMA: LMA inserted LMA Size: 4.0 Number of attempts: 1 Airway Equipment and Method: bite block Placement Confirmation: positive ETCO2 Tube secured with: Tape Dental Injury: Teeth and Oropharynx as per pre-operative assessment

## 2013-11-27 NOTE — Transfer of Care (Signed)
Immediate Anesthesia Transfer of Care Note  Patient: Melissa Ray  Procedure(s) Performed: Procedure(s) (LRB): DILATATION AND CURETTAGE (N/A)  Patient Location: PACU  Anesthesia Type: General  Level of Consciousness: awake, alert  and oriented  Airway & Oxygen Therapy: Patient Spontanous Breathing and Patient connected to nasal cannula oxygen  Post-op Assessment: Report given to PACU RN and Post -op Vital signs reviewed and stable  Post vital signs: Reviewed and stable  Complications: No apparent anesthesia complications

## 2013-11-27 NOTE — Anesthesia Postprocedure Evaluation (Signed)
  Anesthesia Post-op Note  Patient: Melissa Ray  Procedure(s) Performed: Procedure(s) (LRB): DILATATION AND CURETTAGE (N/A)  Patient Location: PACU  Anesthesia Type: General  Level of Consciousness: awake and alert   Airway and Oxygen Therapy: Patient Spontanous Breathing  Post-op Pain: mild  Post-op Assessment: Post-op Vital signs reviewed, Patient's Cardiovascular Status Stable, Respiratory Function Stable, Patent Airway and No signs of Nausea or vomiting  Last Vitals:  Filed Vitals:   11/27/13 0924  BP: 142/93  Pulse: 78  Temp: 35.9 C  Resp: 18    Post-op Vital Signs: stable   Complications: No apparent anesthesia complications

## 2013-11-27 NOTE — Op Note (Signed)
NAMEJERMESHA, Melissa Ray            ACCOUNT NO.:  [id number]  MEDICAL RECORD NO.:  [id number]  LOCATION:                                 FACILITY:  PHYSICIAN:  Marylynn Pearson, MD    DATE OF BIRTH:  11/27/2012  DATE OF PROCEDURE:  11/27/2013 DATE OF DISCHARGE:                              OPERATIVE REPORT   PREOPERATIVE DIAGNOSIS:  History of endometrial hyperplasia with focus of adenocarcinoma.  POSTOPERATIVE DIAGNOSIS:  History of endometrial hyperplasia with focus of adenocarcinoma, pathology pending.  PROCEDURE: 1. Cervical block. 2. Dilation and curettage.  SURGEON:  Marylynn Pearson, MD  ANESTHESIA:  General.  COMPLICATIONS:  None.  SPECIMEN:  Endometrial curettings.  CONDITION:  Stable to recovery room.  PROCEDURE:  The patient was taken to the operating room.  After informed consent was obtained, she was given general anesthesia, placed in dorsal lithotomy position using Allen stirrups, prepped and draped in sterile fashion.  Bivalve speculum was placed in the vagina and 1 mL of 1% lidocaine was injected at the 12 o'clock position of the cervix.  Single- tooth tenaculum was attached to the anterior lip of the cervix, and the remaining 9 mL was used to perform a cervical block.  The cervix was then serially dilated using Pratt dilators.  Curettage was then performed throughout the endometrial cavity.  Specimen was placed on Telfa and passed off to be sent to pathology.  Tenaculum was then removed from the cervix.  The cervix was hemostatic.  Speculum was removed.  She was extubated and taken to the recovery room in stable condition.  Sponge, lap, needle, and instrument counts were correct x2.     Marylynn Pearson, MD    GA/MEDQ  D:  11/27/2013  T:  11/27/2013  Job:  [id number]

## 2013-11-27 NOTE — Discharge Instructions (Signed)
[facility] office 2-3 weeks for postop appointment.  Call the office [phone] for an appointment. ° °Personal Hygiene: °Use pads not tampons x 1week °You may shower, no tub baths or pools for 2-3 weeks °Wipe from front to back when using restroom ° °Activity: °Do not drive or operate any equipment for 24 hrs.   °Do not rest in bed all day °Walking is encouraged °Walk up and down stairs slowly °You may return to your normal activity in 1-2 days ° °Sexual Activity:  No intercourse for 2 weeks after the procedure. ° °Diet: Eat a light meal as desired this evening.  You may resume your usual diet tomorrow. ° °Return to work:  You may resume your work activities after 1-2 days ° °What to expect:  Expect to have vaginal bleeding/discharge for 2-3 days and spotting for 10-14 days.  It is not unusual to have soreness for 1-2 weeks.  You may have a slight burning sensation when you urinate for the first few days.  You may start your menses in 2-6 weeks.  Mild cramps may continue for a couple of days.   ° °Call your doctor:   °Excessive bleeding, saturating a pad every hour °Inability to urinate 6 hours after discharge °Pain not relieved with pain medications °Fever of 100.4 or greater ° ° °Post Anesthesia Home Care Instructions ° °Activity: °Get plenty of rest for the remainder of the day. A responsible adult should stay with you for 24 hours following the procedure.  °For the next 24 hours, DO NOT: °-Drive a car °-Operate machinery °-Drink alcoholic beverages °-Take any medication unless instructed by your physician °-Make any legal decisions or sign important papers. ° °Meals: °Start with liquid foods such as gelatin or soup. Progress to regular foods as tolerated. Avoid greasy, spicy, heavy foods. If nausea and/or vomiting occur, drink only clear liquids until the nausea and/or vomiting subsides. Call your physician if vomiting continues. ° °Special Instructions/Symptoms: °Your throat may feel dry or sore from the anesthesia or  the breathing tube placed in your throat during surgery. If this causes discomfort, gargle with warm salt water. The discomfort should disappear within 24 hours. ° °

## 2013-11-27 NOTE — Anesthesia Preprocedure Evaluation (Addendum)
Anesthesia Evaluation  Patient identified by MRN, date of birth, ID band Patient awake    Reviewed: Allergy & Precautions, H&P , NPO status , Patient's Chart, lab work & pertinent test results  History of Anesthesia Complications (+) history of anesthetic complications  Airway Mallampati: II TM Distance: >3 FB Neck ROM: Full    Dental no notable dental hx.    Pulmonary neg pulmonary ROS,  breath sounds clear to auscultation  Pulmonary exam normal       Cardiovascular Exercise Tolerance: Good hypertension, Pt. on medications and Pt. on home beta blockers Rhythm:Regular Rate:Normal     Neuro/Psych Anxiety negative neurological ROS     GI/Hepatic negative GI ROS, Neg liver ROS,   Endo/Other  diabetes, Type 2Morbid obesity  Renal/GU negative Renal ROS  negative genitourinary   Musculoskeletal negative musculoskeletal ROS (+)   Abdominal (+) + obese,   Peds negative pediatric ROS (+)  Hematology  (+) anemia ,   Anesthesia Other Findings   Reproductive/Obstetrics negative OB ROS                          Anesthesia Physical Anesthesia Plan  ASA: II  Anesthesia Plan: General   Post-op Pain Management:    Induction: Intravenous  Airway Management Planned: LMA  Additional Equipment:   Intra-op Plan:   Post-operative Plan: Extubation in OR  Informed Consent: I have reviewed the patients History and Physical, chart, labs and discussed the procedure including the risks, benefits and alternatives for the proposed anesthesia with the patient or authorized representative who has indicated his/her understanding and acceptance.   Dental advisory given  Plan Discussed with: CRNA  Anesthesia Plan Comments: (Discussed general versus MAC. She has always had general and has done fine. Prefers general.)       Anesthesia Quick Evaluation

## 2013-12-01 ENCOUNTER — Encounter (HOSPITAL_BASED_OUTPATIENT_CLINIC_OR_DEPARTMENT_OTHER): Payer: Self-pay | Admitting: Obstetrics and Gynecology

## 2013-12-01 LAB — GLUCOSE, CAPILLARY: Glucose-Capillary: 98 mg/dL (ref 70–99)

## 2013-12-01 LAB — POCT PREGNANCY, URINE: Preg Test, Ur: NEGATIVE

## 2014-02-02 IMAGING — RF DG HYSTEROGRAM
4 series · 4 of 4 positions shown · non-contrast
Comparison: none

CLINICAL DATA: Infertility. History of complex a typical
endometrial hyperplasia and prior dilatation and curettage of the
uterus.

HYSTEROSALPINGOGRAM
TECHNIQUE: Hysterosalpingogram was performed by the ordering
physician under fluoroscopy.  Fluoroscopic images are submitted for
interpretation following the procedure.

[Series 1: run · 1 of 1 slices shown (1 of 4)]
[im 1/1]
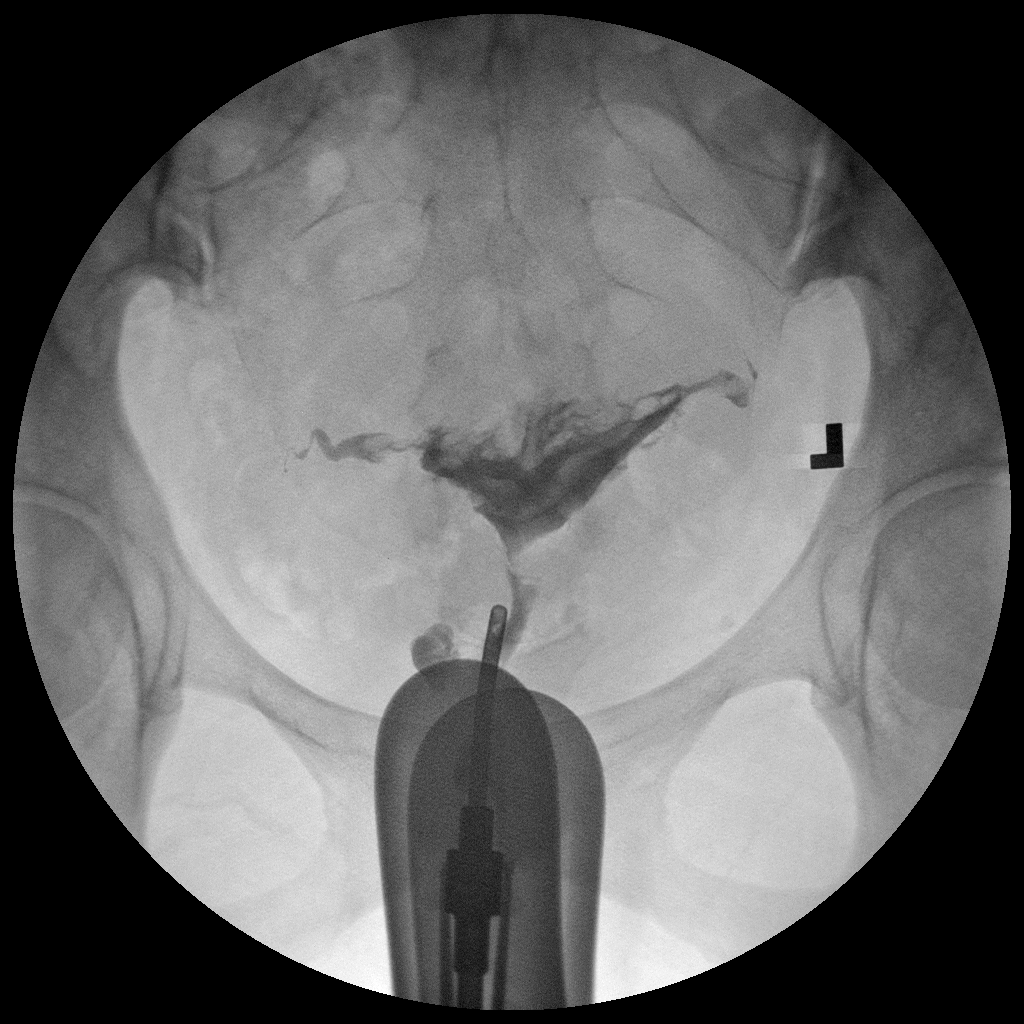

[Series 2: run · 1 of 1 slices shown (2 of 4)]
[im 1/1]
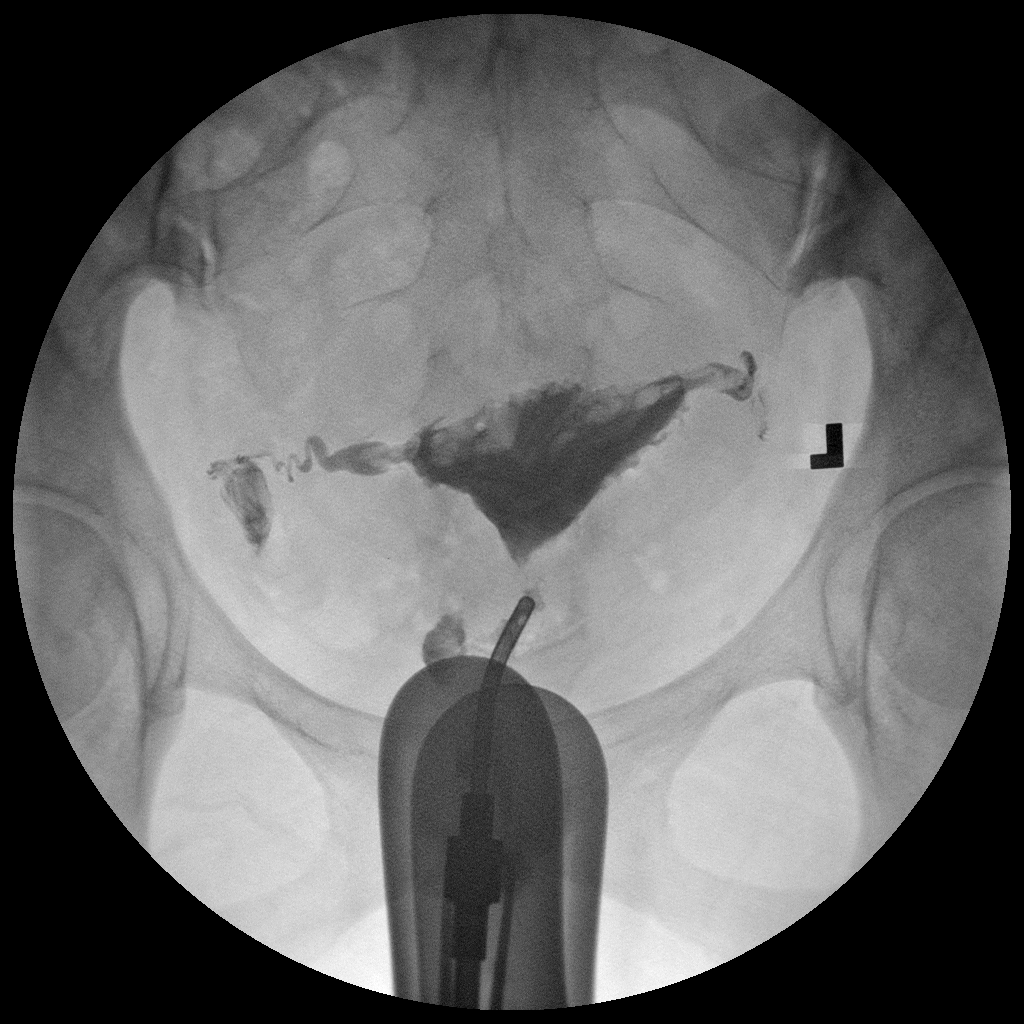

[Series 3: run · 1 of 1 slices shown (3 of 4)]
[im 1/1]
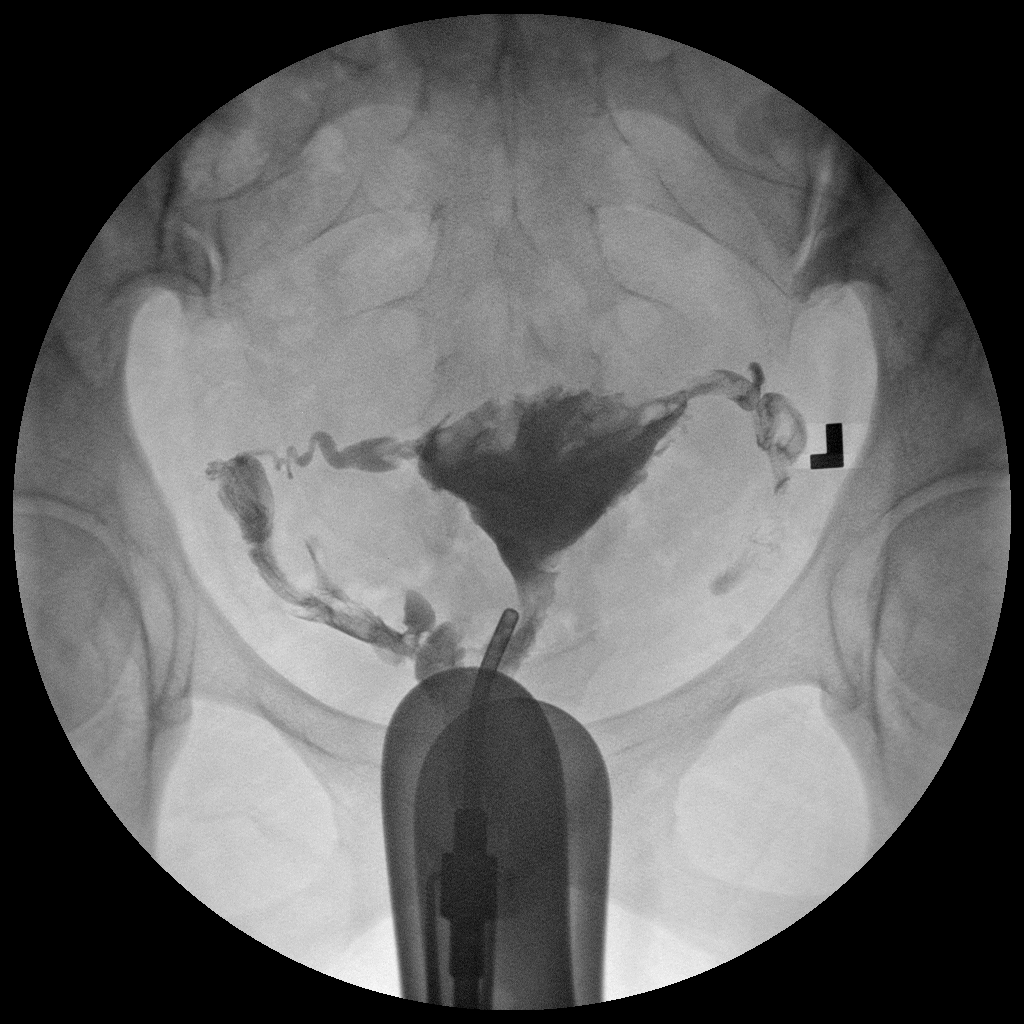

[Series 4: run · 1 of 1 slices shown (4 of 4)]
[im 1/1]
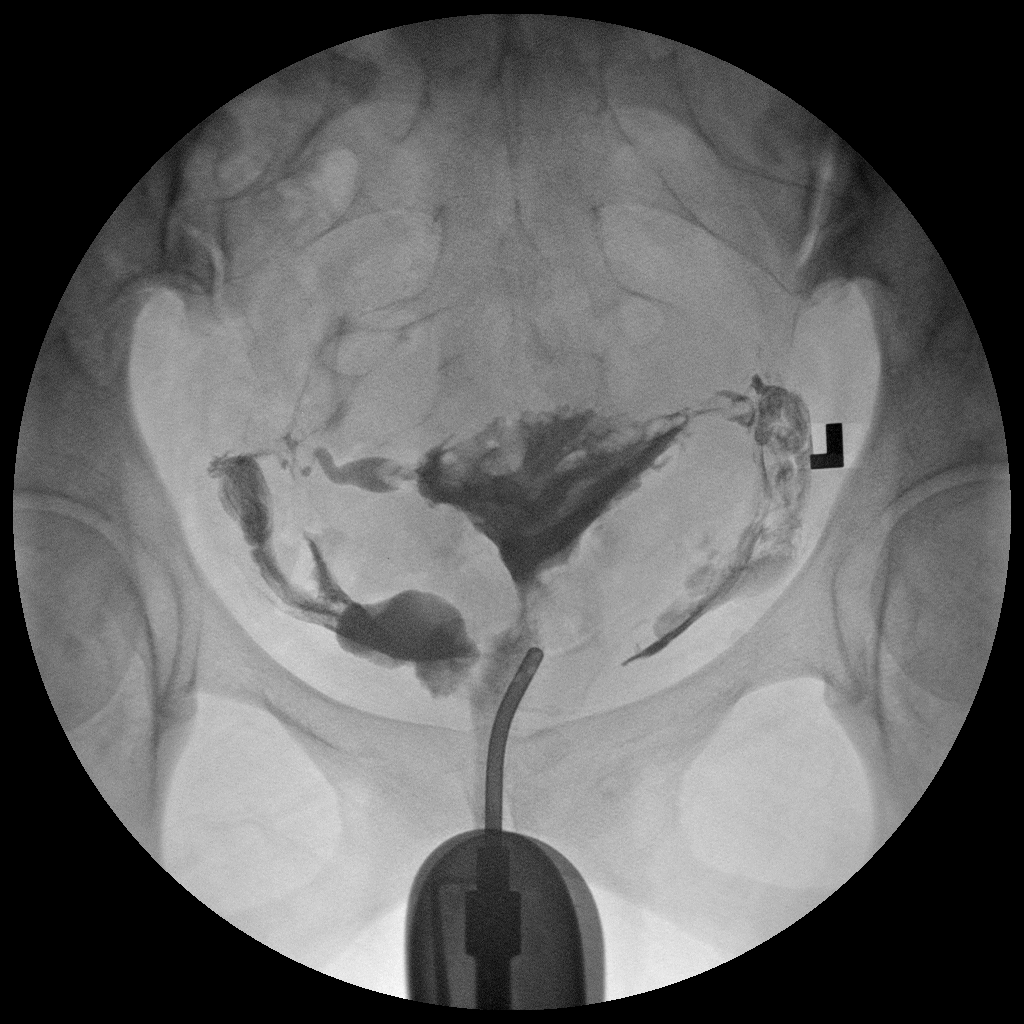

[4 of 4 positions shown; findings below may reference images not displayed]

FINDINGS: Endometrial cavity is normal in shape.  There are very
irregular filling defects in the endometrial cavity, predominately
in the upper body and fundal region.

Both fallopian tubes opacify with contrast and appear mildly
prominent in size.  There is free intraperitoneal spill of contrast
bilaterally.
IMPRESSION: 1.  Irregular filling defects in the fundal and upper body portion
of the endometrial cavity.  Question if this could be secondary to
prior dilatation and curettage.
2.  Fallopian tubes appear prominent in size bilaterally, and are
patent.

## 2014-03-12 ENCOUNTER — Other Ambulatory Visit: Payer: Self-pay | Admitting: Obstetrics and Gynecology

## 2014-04-06 ENCOUNTER — Encounter (HOSPITAL_COMMUNITY): Payer: Self-pay | Admitting: Anesthesiology

## 2014-04-06 ENCOUNTER — Inpatient Hospital Stay (HOSPITAL_COMMUNITY)
Admission: AD | Admit: 2014-04-06 | Discharge: 2014-04-06 | Disposition: A | Discharge status: Home / Self Care | Payer: BC Managed Care – PPO | Source: Ambulatory Visit | Attending: Obstetrics and Gynecology | Admitting: Obstetrics and Gynecology

## 2014-04-06 ENCOUNTER — Encounter (HOSPITAL_COMMUNITY): Payer: Self-pay | Admitting: *Deleted

## 2014-04-06 DIAGNOSIS — N949 Unspecified condition associated with female genital organs and menstrual cycle: Secondary | ICD-10-CM | POA: Diagnosis not present

## 2014-04-06 DIAGNOSIS — D649 Anemia, unspecified: Secondary | ICD-10-CM | POA: Diagnosis not present

## 2014-04-06 DIAGNOSIS — N939 Abnormal uterine and vaginal bleeding, unspecified: Secondary | ICD-10-CM

## 2014-04-06 DIAGNOSIS — R5383 Other fatigue: Secondary | ICD-10-CM

## 2014-04-06 DIAGNOSIS — D72829 Elevated white blood cell count, unspecified: Secondary | ICD-10-CM

## 2014-04-06 DIAGNOSIS — N8501 Benign endometrial hyperplasia: Secondary | ICD-10-CM | POA: Insufficient documentation

## 2014-04-06 DIAGNOSIS — N938 Other specified abnormal uterine and vaginal bleeding: Secondary | ICD-10-CM | POA: Diagnosis not present

## 2014-04-06 DIAGNOSIS — I10 Essential (primary) hypertension: Secondary | ICD-10-CM | POA: Insufficient documentation

## 2014-04-06 DIAGNOSIS — R5381 Other malaise: Secondary | ICD-10-CM | POA: Insufficient documentation

## 2014-04-06 DIAGNOSIS — N925 Other specified irregular menstruation: Secondary | ICD-10-CM | POA: Diagnosis not present

## 2014-04-06 LAB — CBC
HCT: 29.2 % — ABNORMAL LOW (ref 36.0–46.0)
HEMOGLOBIN: 9.6 g/dL — AB (ref 12.0–15.0)
MCH: 25 pg — AB (ref 26.0–34.0)
MCHC: 32.9 g/dL (ref 30.0–36.0)
MCV: 76 fL — AB (ref 78.0–100.0)
Platelets: 344 10*3/uL (ref 150–400)
RBC: 3.84 MIL/uL — ABNORMAL LOW (ref 3.87–5.11)
RDW: 15.2 % (ref 11.5–15.5)
WBC: 20.2 10*3/uL — ABNORMAL HIGH (ref 4.0–10.5)

## 2014-04-06 LAB — COMPREHENSIVE METABOLIC PANEL
ALK PHOS: 42 U/L (ref 39–117)
ALT: 15 U/L (ref 0–35)
ANION GAP: 15 (ref 5–15)
AST: 13 U/L (ref 0–37)
Albumin: 3.1 g/dL — ABNORMAL LOW (ref 3.5–5.2)
BUN: 11 mg/dL (ref 6–23)
CALCIUM: 9 mg/dL (ref 8.4–10.5)
CO2: 23 mEq/L (ref 19–32)
Chloride: 98 mEq/L (ref 96–112)
Creatinine, Ser: 0.62 mg/dL (ref 0.50–1.10)
GFR calc Af Amer: >90 mL/min (ref >90)
GFR calc non Af Amer: >90 mL/min (ref >90)
GLUCOSE: 167 mg/dL — AB (ref 70–99)
POTASSIUM: 3.2 meq/L — AB (ref 3.7–5.3)
Sodium: 136 mEq/L — ABNORMAL LOW (ref 137–147)
Total Bilirubin: 0.5 mg/dL (ref 0.3–1.2)
Total Protein: 6.6 g/dL (ref 6.0–8.3)

## 2014-04-06 LAB — GLUCOSE, CAPILLARY: GLUCOSE-CAPILLARY: 149 mg/dL — AB (ref 70–99)

## 2014-04-06 LAB — POCT PREGNANCY, URINE: Preg Test, Ur: NEGATIVE

## 2014-04-06 MED ORDER — LACTATED RINGERS IV SOLN
INTRAVENOUS | Status: DC
Start: 2014-04-06 — End: 2014-04-06
  Administered 2014-04-06: 18:00:00 via INTRAVENOUS

## 2014-04-06 NOTE — Discharge Instructions (Signed)
Abnormal Uterine Bleeding Abnormal uterine bleeding can affect women at various stages in life, including teenagers, women in their reproductive years, pregnant women, and women who have reached menopause. Several kinds of uterine bleeding are considered abnormal, including:  Bleeding or spotting between periods.   Bleeding after sexual intercourse.   Bleeding that is heavier or more than normal.   Periods that last longer than usual.  Bleeding after menopause.  Many cases of abnormal uterine bleeding are minor and simple to treat, while others are more serious. Any type of abnormal bleeding should be evaluated by your health care provider. Treatment will depend on the cause of the bleeding. HOME CARE INSTRUCTIONS Monitor your condition for any changes. The following actions may help to alleviate any discomfort you are experiencing:  Avoid the use of tampons and douches as directed by your health care provider.  Change your pads frequently. You should get regular pelvic exams and Pap tests. Keep all follow-up appointments for diagnostic tests as directed by your health care provider.  SEEK MEDICAL CARE IF:   Your bleeding lasts more than 1 week.   You feel dizzy at times.  SEEK IMMEDIATE MEDICAL CARE IF:   You pass out.   You are changing pads every 15 to 30 minutes.   You have abdominal pain.  You have a fever.   You become sweaty or weak.   You are passing large blood clots from the vagina.   You start to feel nauseous and vomit. MAKE SURE YOU:   Understand these instructions.  Will watch your condition.  Will get help right away if you are not doing well or get worse. Document Released: 08/14/2005 Document Revised: 08/19/2013 Document Reviewed: 03/13/2013 Exit[employer] Patient Information 2015 [employer]. This information is not intended to replace advice given to you by your health care provider. Make sure you discuss any questions you have with your  health care provider.  

## 2014-04-06 NOTE — MAU Provider Note (Signed)
History     CSN: [id number]  Arrival date and time: 04/06/14 1608   First Provider Initiated Contact with Patient 04/06/14 1704      Chief Complaint  Patient presents with   Vaginal Bleeding   Fatigue   HPI  33 y.o. Melissa Ray G0P0 presents today complaining that she feels weak and like she did previously when she was anemia with a hemoglobin <6.  She reports that she has endometrial hyperplasia and has had multiple procedures to deal with this, while attempting to preserve fertility.  She was previously on daily megace but was changed to [place] 2 weeks ago.  Yesterday, when on day 14 of this new OCP, she began cramping and then bleeding profusely with large clots.  She called the office and was told to increase to 2 tabs per day of lomedia.  She took 2 tabs this am and her bleeding initially improved but then returned.  This is when she began to feel weak and was prompted to come to MAU. She denies fever, chills, nausea, vomiting, diarrhea, cough, shortness of breath, uri sxs.      OB History   Grav Para Term Preterm Abortions TAB SAB Ect Mult Living   0               Past Medical History  Diagnosis Date   Hypertension    Anxiety     situational   Polycystic disease, ovaries    Complication of anesthesia 2003    during D&C, pt states her temp spiked during surgery   Complex endometrial hyperplasia     DX SINCE AGE 89   Prediabetes    Anemia     last transfusion oct 2013   Wears contact lenses    Menorrhagia with irregular cycle     Past Surgical History  Procedure Laterality Date   Hysteroscopy w/d&c  05/14/2012    Procedure: DILATATION AND CURETTAGE /HYSTEROSCOPY;  Surgeon: Marylynn Pearson, MD;  Location: [facility];  Service: Gynecology;  Laterality: N/A;   Hysteroscopy w/d&c N/A 05/06/2013    Procedure: DILATATION AND CURETTAGE /HYSTEROSCOPY;  Surgeon: Marylynn Pearson, MD;  Location: [facility];  Service: Gynecology;  Laterality: N/A;   Tonsillectomy   as child   Wisdom tooth extraction  as teen   Dilation and curettage of uterus  06/28/2012    Procedure: DILATATION AND CURETTAGE;  Surgeon: Allena Katz, MD;  Location: [facility];  Service: Gynecology;  Laterality: N/A;  SUCTION  USED   Dilation and curettage of uterus  2003   &  2008   Hysteroscopy w/d&c N/A 11/27/2013    Procedure: DILATATION AND CURETTAGE;  Surgeon: Marylynn Pearson, MD;  Location: [facility];  Service: Gynecology;  Laterality: N/A;    Family History  Problem Relation Age of Onset   Heart disease Mother    Diabetes Mother    Heart disease Father    Diabetes Father    Cancer Father    Polycystic ovary syndrome Sister     History  Substance Use Topics   Smoking status: Never Smoker    Smokeless tobacco: Never Used   Alcohol Use: No    Allergies: No Known Allergies  Prescriptions prior to admission  Medication Sig Dispense Refill   ibuprofen (ADVIL,MOTRIN) 200 MG tablet Take 600 mg by mouth every 6 (six) hours as needed for cramping.        Iron-Vitamin C (VITRON-C PO) Take 1 tablet by mouth daily.  metFORMIN (GLUCOPHAGE-XR) 500 MG 24 hr tablet Take 500 mg by mouth 2 (two) times daily.        metoprolol-hydrochlorothiazide (LOPRESSOR HCT) 50-25 MG per tablet Take 1 tablet by mouth every morning.        Norethin Ace-Eth Estrad-FE (LOMEDIA 24 FE PO) Take 1 tablet by mouth daily. Pt took two tablets by mouth today (04/06/14) as instructed by her MD for her bleeding.       Prenatal Vit-Fe Fumarate-FA (PRENATAL MULTIVITAMIN) TABS tablet Take 1 tablet by mouth daily.        Review of Systems  Constitutional: Negative for fever and chills.  HENT: Negative for congestion and sore throat.   Eyes: Negative for blurred vision.  Respiratory: Negative for cough and shortness of breath.   Cardiovascular: Negative for chest pain and palpitations.  Gastrointestinal: Negative for nausea, vomiting and diarrhea.  Genitourinary:  Negative for dysuria.  Skin: Negative for rash.  Neurological: Positive for weakness. Negative for dizziness and headaches.   Physical Exam   Blood pressure 95/65, pulse 107, temperature 98.2 F (36.8 C), temperature source Oral, resp. rate 20, height 5\' 5"  (1.651 m), weight 228 lb (103.42 kg), last menstrual period 04/05/2014, SpO2 100.00%.  Physical Exam  Constitutional: She is oriented to person, place, and time. She appears well-developed and well-nourished. No distress.  HENT:  Head: Normocephalic and atraumatic.  Eyes: EOM are normal.  Neck: Normal range of motion.  Cardiovascular: Normal rate, regular rhythm and normal heart sounds.  Exam reveals no gallop and no friction rub.   No murmur heard. Respiratory: Effort normal and breath sounds normal. No respiratory distress.  GI: Soft. She exhibits no distension. There is tenderness.  bilat lower quadrants with mild tenderness  Genitourinary:  Vagina with blood in vault  Musculoskeletal: Normal range of motion.  Neurological: She is alert and oriented to person, place, and time.  Skin:  On initial observation, pt with significant pallor, lips were pasty white.  On subsequent encounters, color improved with pink lips and color returning to cheeks as well.   Results for orders placed during the hospital encounter of 04/06/14 (from the past 24 hour(s))  POCT PREGNANCY, URINE     Status: None   Collection Time    04/06/14  5:11 PM      Result Value Ref Range   Preg Test, Ur NEGATIVE  NEGATIVE  CBC     Status: Abnormal   Collection Time    04/06/14  5:38 PM      Result Value Ref Range   WBC 20.2 (*) 4.0 - 10.5 K/uL   RBC 3.84 (*) 3.87 - 5.11 MIL/uL   Hemoglobin 9.6 (*) 12.0 - 15.0 g/dL   HCT 29.2 (*) 36.0 - 46.0 %   MCV 76.0 (*) 78.0 - 100.0 fL   MCH 25.0 (*) 26.0 - 34.0 pg   MCHC 32.9  30.0 - 36.0 g/dL   RDW 15.2  11.5 - 15.5 %   Platelets 344  150 - 400 K/uL  COMPREHENSIVE METABOLIC PANEL     Status: Abnormal    Collection Time    04/06/14  5:38 PM      Result Value Ref Range   Sodium 136 (*) 137 - 147 mEq/L   Potassium 3.2 (*) 3.7 - 5.3 mEq/L   Chloride 98  96 - 112 mEq/L   CO2 23  19 - 32 mEq/L   Glucose, Bld 167 (*) 70 - 99 mg/dL   BUN 11  6 - 23 mg/dL   Creatinine, Ser 0.62  0.50 - 1.10 mg/dL   Calcium 9.0  8.4 - 10.5 mg/dL   Total Protein 6.6  6.0 - 8.3 g/dL   Albumin 3.1 (*) 3.5 - 5.2 g/dL   AST 13  0 - 37 U/L   ALT 15  0 - 35 U/L   Alkaline Phosphatase 42  39 - 117 U/L   Total Bilirubin 0.5  0.3 - 1.2 mg/dL   GFR calc non Af Amer >90  >90 mL/min   GFR calc Af Amer >90  >90 mL/min   Anion gap 15  5 - 15  GLUCOSE, CAPILLARY     Status: Abnormal   Collection Time    04/06/14  5:50 PM      Result Value Ref Range   Glucose-Capillary 149 (*) 70 - 99 mg/dL   MAU Course  Procedures IV with 1 liter LR  MDM Discussed with Dr. Matthew Saras.    Assessment and Plan  Assessment: Vaginal bleeding with associated weakness, incidental leukocytosis  Plan:  Discharge to home Continue Lomedia 2 tabs per day until Dr. Julien Girt advises further Restart iron supplement See Dr. Julien Girt tomorrow in clinic for further eval.   Bleeding precautions Return to MAU for emergency  Paticia Stack 04/06/2014, 7:11 PM

## 2014-04-06 NOTE — MAU Note (Signed)
Heavy bleeding for last 2 days, hx of severe hyperplasia, being treated by Dr. Julien Girt,  has been on megace, now on [place] for the past 14 days.  Passing large clots, SOB & fatigue with any exertion.  Hx of anemia & blood transfusion in 2013.

## 2014-04-06 NOTE — MAU Note (Signed)
Urine in lab 

## 2014-06-09 ENCOUNTER — Other Ambulatory Visit: Payer: Self-pay | Admitting: Obstetrics and Gynecology

## 2014-06-09 ENCOUNTER — Encounter (HOSPITAL_COMMUNITY): Payer: Self-pay | Admitting: Pharmacist

## 2014-06-10 LAB — CYTOLOGY - PAP

## 2014-06-18 ENCOUNTER — Encounter (HOSPITAL_COMMUNITY): Payer: Self-pay

## 2014-06-18 ENCOUNTER — Encounter (HOSPITAL_COMMUNITY)
Admission: RE | Admit: 2014-06-18 | Discharge: 2014-06-18 | Disposition: A | Discharge status: Home / Self Care | Payer: BC Managed Care – PPO | Source: Ambulatory Visit | Attending: Obstetrics and Gynecology | Admitting: Obstetrics and Gynecology

## 2014-06-18 DIAGNOSIS — Z01812 Encounter for preprocedural laboratory examination: Secondary | ICD-10-CM | POA: Diagnosis not present

## 2014-06-18 HISTORY — DX: Type 2 diabetes mellitus without complications: E11.9

## 2014-06-18 HISTORY — DX: Shortness of breath: R06.02

## 2014-06-18 LAB — CBC
HEMATOCRIT: 33.5 % — AB (ref 36.0–46.0)
HEMOGLOBIN: 9.9 g/dL — AB (ref 12.0–15.0)
MCH: 20.6 pg — ABNORMAL LOW (ref 26.0–34.0)
MCHC: 29.6 g/dL — ABNORMAL LOW (ref 30.0–36.0)
MCV: 69.8 fL — ABNORMAL LOW (ref 78.0–100.0)
Platelets: 479 10*3/uL — ABNORMAL HIGH (ref 150–400)
RBC: 4.8 MIL/uL (ref 3.87–5.11)
RDW: 17.4 % — ABNORMAL HIGH (ref 11.5–15.5)
WBC: 12.9 10*3/uL — ABNORMAL HIGH (ref 4.0–10.5)

## 2014-06-18 LAB — BASIC METABOLIC PANEL
Anion gap: 15 (ref 5–15)
BUN: 9 mg/dL (ref 6–23)
CHLORIDE: 102 meq/L (ref 96–112)
CO2: 23 mEq/L (ref 19–32)
Calcium: 9.8 mg/dL (ref 8.4–10.5)
Creatinine, Ser: 0.6 mg/dL (ref 0.50–1.10)
GFR calc non Af Amer: >90 mL/min (ref >90)
Glucose, Bld: 125 mg/dL — ABNORMAL HIGH (ref 70–99)
Potassium: 3.2 mEq/L — ABNORMAL LOW (ref 3.7–5.3)
SODIUM: 140 meq/L (ref 137–147)

## 2014-06-18 NOTE — Patient Instructions (Addendum)
Your procedure is scheduled on:  Tuesday, Oct. 27, 2015  Enter through the [employer] of [facility] at: 6:00 a.m.  Pick up the phone at the desk and dial [phone].  Call this number if you have problems the morning of surgery: [phone].  Remember: Do NOT eat food: After midnight Monday Do NOT drink clear liquids after:  After midnight Monday Take these medicines the morning of surgery with a SIP OF WATER: Metoprolol Do not take Metformin 24 hours before surgery  Do NOT wear jewelry (body piercing), metal hair clips/bobby pins, make-up, or nail polish. Do NOT wear lotions, powders, or perfumes.  You may wear deoderant. Do NOT shave for 48 hours prior to surgery. Do NOT bring valuables to the hospital. Contacts, dentures, or bridgework may not be worn into surgery. Leave suitcase in car.  After surgery it may be brought to your room.  For patients admitted to the hospital, checkout time is 11:00 AM the day of discharge.

## 2014-06-21 NOTE — H&P (Addendum)
Melissa Ray is an 33 y.o. female with menorrhagia and h/o endometrial hyperplasia with focus of adenocarcinoma presents for surgical mngt.  Pt continues to have menorrhagia despite medical managemetn and multiple D&C procedures.  Last Embx was negative for hyperplasia.    Past Medical History  Diagnosis Date  . Hypertension   . Anxiety     situational  . Polycystic disease, ovaries   . Complication of anesthesia 2003    during D&C, pt states her temp spiked during surgery  . Complex endometrial hyperplasia     DX SINCE AGE 57  . Prediabetes   . Anemia     last transfusion oct 2013  . Wears contact lenses   . Menorrhagia with irregular cycle   . Diabetes mellitus without complication     borderline  . Shortness of breath     seasonal allergies    Past Surgical History  Procedure Laterality Date  . Hysteroscopy w/d&c  05/14/2012    Procedure: DILATATION AND CURETTAGE /HYSTEROSCOPY;  Surgeon: Marylynn Pearson, MD;  Location: [facility];  Service: Gynecology;  Laterality: N/A;  . Hysteroscopy w/d&c N/A 05/06/2013    Procedure: DILATATION AND CURETTAGE /HYSTEROSCOPY;  Surgeon: Marylynn Pearson, MD;  Location: [facility];  Service: Gynecology;  Laterality: N/A;  . Tonsillectomy  as child  . Wisdom tooth extraction  as teen  . Dilation and curettage of uterus  06/28/2012    Procedure: DILATATION AND CURETTAGE;  Surgeon: Allena Katz, MD;  Location: [facility];  Service: Gynecology;  Laterality: N/A;  SUCTION  USED  . Dilation and curettage of uterus  2003   &  2008  . Hysteroscopy w/d&c N/A 11/27/2013    Procedure: DILATATION AND CURETTAGE;  Surgeon: Marylynn Pearson, MD;  Location: [facility];  Service: Gynecology;  Laterality: N/A;    Family History  Problem Relation Age of Onset  . Heart disease Mother   . Diabetes Mother   . Heart disease Father   . Diabetes Father   . Cancer Father   . Polycystic ovary syndrome Sister     Social History:  reports that she has  never smoked. She has never used smokeless tobacco. She reports that she does not drink alcohol or use illicit drugs.  Allergies: No Known Allergies  No prescriptions prior to admission  Meds:  Megace, metformin, claritin, lopressor, PNV, advil, xanex prn  ROS Physical Exam AF, VSS Gen - NAD Abd - soft, NT CV - RRR Lungs - clear PV - uterus mobile, NT.  No adnexal masses  Assessment/Plan:  Menorrhagia and h/o endometrial hyperplasia with focus of adenocarconoma LAVH with bilateral salpingectomy R/b/a discussed, questions answered, informed consnet  Melissa Ray 06/21/2014, 5:19 PM

## 2014-06-22 ENCOUNTER — Encounter (HOSPITAL_COMMUNITY): Payer: Self-pay | Admitting: Anesthesiology

## 2014-06-22 MED ORDER — DEXTROSE 5 % IV SOLN
2.0000 g | INTRAVENOUS | Status: AC
  Administered 2014-06-23: 2 g via INTRAVENOUS
  Filled 2014-06-22: qty 2

## 2014-06-22 NOTE — Anesthesia Preprocedure Evaluation (Addendum)
Anesthesia Evaluation  Patient identified by MRN, date of birth, ID band Patient awake    Reviewed: Allergy & Precautions, H&P , NPO status , Patient's Chart, lab work & pertinent test results, reviewed documented beta blocker date and time   History of Anesthesia Complications (+) history of anesthetic complications  Airway Mallampati: III  TM Distance: >3 FB Neck ROM: Full    Dental no notable dental hx. (+) Teeth Intact   Pulmonary shortness of breath and with exertion,  breath sounds clear to auscultation  Pulmonary exam normal       Cardiovascular hypertension, Pt. on medications and Pt. on home beta blockers Rhythm:Regular Rate:Normal     Neuro/Psych PSYCHIATRIC DISORDERS Anxiety    GI/Hepatic Neg liver ROS, IBS   Endo/Other  diabetesMorbid obesity"Prediabetic" PCOS  Renal/GU negative Renal ROS  negative genitourinary   Musculoskeletal negative musculoskeletal ROS (+)   Abdominal (+) + obese,   Peds  Hematology  (+) anemia ,   Anesthesia Other Findings   Reproductive/Obstetrics Complex atypical endometrial hyperplasia                            Anesthesia Physical Anesthesia Plan  ASA: III  Anesthesia Plan: General   Post-op Pain Management:    Induction: Intravenous  Airway Management Planned: Oral ETT  Additional Equipment:   Intra-op Plan:   Post-operative Plan: Extubation in OR  Informed Consent: I have reviewed the patients History and Physical, chart, labs and discussed the procedure including the risks, benefits and alternatives for the proposed anesthesia with the patient or authorized representative who has indicated his/her understanding and acceptance.   Dental advisory given  Plan Discussed with: CRNA, Anesthesiologist and Surgeon  Anesthesia Plan Comments:         Anesthesia Quick Evaluation

## 2014-06-23 ENCOUNTER — Encounter (HOSPITAL_COMMUNITY): Payer: BC Managed Care – PPO | Admitting: Anesthesiology

## 2014-06-23 ENCOUNTER — Encounter (HOSPITAL_COMMUNITY): Payer: Self-pay

## 2014-06-23 ENCOUNTER — Encounter (HOSPITAL_COMMUNITY)
Admission: RE | Disposition: A | Discharge status: Home / Self Care | Payer: Self-pay | Source: Ambulatory Visit | Attending: Obstetrics and Gynecology

## 2014-06-23 ENCOUNTER — Ambulatory Visit (HOSPITAL_COMMUNITY): Payer: BC Managed Care – PPO | Admitting: Anesthesiology

## 2014-06-23 ENCOUNTER — Inpatient Hospital Stay (HOSPITAL_COMMUNITY)
Admission: RE | Admit: 2014-06-23 | Discharge: 2014-06-25 | DRG: 743 | Disposition: A | Discharge status: Home / Self Care | Payer: BC Managed Care – PPO | Source: Ambulatory Visit | Attending: Obstetrics and Gynecology | Admitting: Obstetrics and Gynecology

## 2014-06-23 DIAGNOSIS — E282 Polycystic ovarian syndrome: Secondary | ICD-10-CM | POA: Diagnosis present

## 2014-06-23 DIAGNOSIS — I10 Essential (primary) hypertension: Secondary | ICD-10-CM | POA: Diagnosis present

## 2014-06-23 DIAGNOSIS — R7309 Other abnormal glucose: Secondary | ICD-10-CM | POA: Diagnosis present

## 2014-06-23 DIAGNOSIS — Z9889 Other specified postprocedural states: Secondary | ICD-10-CM | POA: Diagnosis not present

## 2014-06-23 DIAGNOSIS — N920 Excessive and frequent menstruation with regular cycle: Principal | ICD-10-CM | POA: Diagnosis present

## 2014-06-23 DIAGNOSIS — N8501 Benign endometrial hyperplasia: Secondary | ICD-10-CM | POA: Diagnosis present

## 2014-06-23 DIAGNOSIS — J302 Other seasonal allergic rhinitis: Secondary | ICD-10-CM | POA: Diagnosis present

## 2014-06-23 DIAGNOSIS — D649 Anemia, unspecified: Secondary | ICD-10-CM | POA: Diagnosis present

## 2014-06-23 DIAGNOSIS — Z833 Family history of diabetes mellitus: Secondary | ICD-10-CM | POA: Diagnosis not present

## 2014-06-23 DIAGNOSIS — F418 Other specified anxiety disorders: Secondary | ICD-10-CM | POA: Diagnosis present

## 2014-06-23 HISTORY — PX: LAPAROSCOPIC ASSISTED VAGINAL HYSTERECTOMY: SHX5398

## 2014-06-23 HISTORY — PX: ABDOMINAL HYSTERECTOMY: SHX81

## 2014-06-23 LAB — GLUCOSE, CAPILLARY
GLUCOSE-CAPILLARY: 118 mg/dL — AB (ref 70–99)
Glucose-Capillary: 143 mg/dL — ABNORMAL HIGH (ref 70–99)

## 2014-06-23 LAB — COMPREHENSIVE METABOLIC PANEL
ALK PHOS: 60 U/L (ref 39–117)
ALT: 22 U/L (ref 0–35)
AST: 24 U/L (ref 0–37)
Albumin: 4 g/dL (ref 3.5–5.2)
Anion gap: 15 (ref 5–15)
BILIRUBIN TOTAL: 1.4 mg/dL — AB (ref 0.3–1.2)
BUN: 9 mg/dL (ref 6–23)
CALCIUM: 10 mg/dL (ref 8.4–10.5)
CHLORIDE: 96 meq/L (ref 96–112)
CO2: 26 meq/L (ref 19–32)
Creatinine, Ser: 0.67 mg/dL (ref 0.50–1.10)
GFR calc Af Amer: >90 mL/min (ref >90)
Glucose, Bld: 124 mg/dL — ABNORMAL HIGH (ref 70–99)
POTASSIUM: 3.3 meq/L — AB (ref 3.7–5.3)
SODIUM: 137 meq/L (ref 137–147)
Total Protein: 8 g/dL (ref 6.0–8.3)

## 2014-06-23 LAB — TYPE AND SCREEN
ABO/RH(D): O POS
Antibody Screen: NEGATIVE

## 2014-06-23 LAB — CBC
HCT: 35 % — ABNORMAL LOW (ref 36.0–46.0)
HEMOGLOBIN: 10.4 g/dL — AB (ref 12.0–15.0)
MCH: 20.8 pg — AB (ref 26.0–34.0)
MCHC: 29.7 g/dL — ABNORMAL LOW (ref 30.0–36.0)
MCV: 69.9 fL — ABNORMAL LOW (ref 78.0–100.0)
Platelets: 487 10*3/uL — ABNORMAL HIGH (ref 150–400)
RBC: 5.01 MIL/uL (ref 3.87–5.11)
RDW: 17.8 % — ABNORMAL HIGH (ref 11.5–15.5)
WBC: 10.9 10*3/uL — AB (ref 4.0–10.5)

## 2014-06-23 LAB — PREGNANCY, URINE: Preg Test, Ur: NEGATIVE

## 2014-06-23 SURGERY — HYSTERECTOMY, VAGINAL, LAPAROSCOPY-ASSISTED
Anesthesia: General | Site: Abdomen

## 2014-06-23 MED ORDER — PROPOFOL 10 MG/ML IV EMUL
INTRAVENOUS | Status: AC
  Filled 2014-06-23: qty 20

## 2014-06-23 MED ORDER — FENTANYL CITRATE 0.05 MG/ML IJ SOLN
INTRAMUSCULAR | Status: AC
  Filled 2014-06-23: qty 5

## 2014-06-23 MED ORDER — KETOROLAC TROMETHAMINE 30 MG/ML IJ SOLN
30.0000 mg | Freq: Once | INTRAMUSCULAR | Status: AC
  Administered 2014-06-23: 30 mg via INTRAVENOUS

## 2014-06-23 MED ORDER — SODIUM CHLORIDE 0.9 % IJ SOLN: 9.0000 mL | INTRAMUSCULAR | Status: DC | PRN

## 2014-06-23 MED ORDER — METFORMIN HCL ER 500 MG PO TB24
500.0000 mg | ORAL_TABLET | Freq: Two times a day (BID) | ORAL | Status: DC
  Administered 2014-06-24 – 2014-06-25 (×3): 500 mg via ORAL
  Filled 2014-06-23 (×4): qty 1

## 2014-06-23 MED ORDER — DIPHENHYDRAMINE HCL 12.5 MG/5ML PO ELIX: 12.5000 mg | ORAL_SOLUTION | Freq: Four times a day (QID) | ORAL | Status: DC | PRN

## 2014-06-23 MED ORDER — FENTANYL CITRATE 0.05 MG/ML IJ SOLN
INTRAMUSCULAR | Status: DC | PRN
  Administered 2014-06-23: 50 ug via INTRAVENOUS
  Administered 2014-06-23 (×2): 100 ug via INTRAVENOUS
  Administered 2014-06-23 (×2): 50 ug via INTRAVENOUS

## 2014-06-23 MED ORDER — MIDAZOLAM HCL 2 MG/2ML IJ SOLN
INTRAMUSCULAR | Status: AC
Start: 2014-06-23 — End: 2014-06-23
  Filled 2014-06-23: qty 2

## 2014-06-23 MED ORDER — BUPIVACAINE HCL (PF) 0.25 % IJ SOLN
INTRAMUSCULAR | Status: AC
Start: 2014-06-23 — End: 2014-06-23
  Filled 2014-06-23: qty 30

## 2014-06-23 MED ORDER — HYDROMORPHONE HCL 1 MG/ML IJ SOLN
INTRAMUSCULAR | Status: AC
  Administered 2014-06-23: 0.5 mg via INTRAVENOUS
  Filled 2014-06-23: qty 1

## 2014-06-23 MED ORDER — MIDAZOLAM HCL 2 MG/2ML IJ SOLN
INTRAMUSCULAR | Status: DC | PRN
  Administered 2014-06-23: 2 mg via INTRAVENOUS

## 2014-06-23 MED ORDER — ONDANSETRON HCL 4 MG PO TABS: 4.0000 mg | ORAL_TABLET | Freq: Four times a day (QID) | ORAL | Status: DC | PRN

## 2014-06-23 MED ORDER — FENTANYL CITRATE 0.05 MG/ML IJ SOLN
INTRAMUSCULAR | Status: AC
  Filled 2014-06-23: qty 2

## 2014-06-23 MED ORDER — LACTATED RINGERS IV SOLN
INTRAVENOUS | Status: DC
  Administered 2014-06-23 (×4): via INTRAVENOUS

## 2014-06-23 MED ORDER — ONDANSETRON HCL 4 MG/2ML IJ SOLN: 4.0000 mg | Freq: Four times a day (QID) | INTRAMUSCULAR | Status: DC | PRN

## 2014-06-23 MED ORDER — HYDROMORPHONE HCL 1 MG/ML IJ SOLN
0.2500 mg | INTRAMUSCULAR | Status: DC | PRN
  Administered 2014-06-23 (×3): 0.5 mg via INTRAVENOUS

## 2014-06-23 MED ORDER — GLYCOPYRROLATE 0.2 MG/ML IJ SOLN
INTRAMUSCULAR | Status: AC
  Filled 2014-06-23: qty 2

## 2014-06-23 MED ORDER — DEXAMETHASONE SODIUM PHOSPHATE 4 MG/ML IJ SOLN
INTRAMUSCULAR | Status: DC | PRN
  Administered 2014-06-23: 4 mg via INTRAVENOUS

## 2014-06-23 MED ORDER — GLYCOPYRROLATE 0.2 MG/ML IJ SOLN
INTRAMUSCULAR | Status: DC | PRN
  Administered 2014-06-23: .6 mg via INTRAVENOUS

## 2014-06-23 MED ORDER — PROPOFOL 10 MG/ML IV BOLUS
INTRAVENOUS | Status: DC | PRN
  Administered 2014-06-23: 200 mg via INTRAVENOUS

## 2014-06-23 MED ORDER — KETOROLAC TROMETHAMINE 30 MG/ML IJ SOLN
30.0000 mg | Freq: Three times a day (TID) | INTRAMUSCULAR | Status: DC
  Administered 2014-06-23 – 2014-06-25 (×5): 30 mg via INTRAVENOUS
  Filled 2014-06-23 (×5): qty 1

## 2014-06-23 MED ORDER — ONDANSETRON HCL 4 MG/2ML IJ SOLN
INTRAMUSCULAR | Status: AC
  Filled 2014-06-23: qty 2

## 2014-06-23 MED ORDER — METOPROLOL-HYDROCHLOROTHIAZIDE 50-25 MG PO TABS: 1.0000 | ORAL_TABLET | Freq: Every morning | ORAL | Status: DC

## 2014-06-23 MED ORDER — HYDROMORPHONE 0.3 MG/ML IV SOLN
INTRAVENOUS | Status: DC
  Administered 2014-06-23: 12:00:00 via INTRAVENOUS
  Administered 2014-06-23: 9.33 mg via INTRAVENOUS
  Administered 2014-06-23: 5.19 mg via INTRAVENOUS
  Administered 2014-06-23: 10.67 mg via INTRAVENOUS
  Administered 2014-06-24: 0.185 mg via INTRAVENOUS
  Filled 2014-06-23: qty 25

## 2014-06-23 MED ORDER — LIDOCAINE HCL (CARDIAC) 20 MG/ML IV SOLN
INTRAVENOUS | Status: DC | PRN
  Administered 2014-06-23: 60 mg via INTRAVENOUS

## 2014-06-23 MED ORDER — MEPERIDINE HCL 25 MG/ML IJ SOLN: 6.2500 mg | INTRAMUSCULAR | Status: DC | PRN

## 2014-06-23 MED ORDER — ROCURONIUM BROMIDE 100 MG/10ML IV SOLN
INTRAVENOUS | Status: DC | PRN
  Administered 2014-06-23: 10 mg via INTRAVENOUS
  Administered 2014-06-23: 30 mg via INTRAVENOUS
  Administered 2014-06-23 (×3): 10 mg via INTRAVENOUS

## 2014-06-23 MED ORDER — METOPROLOL TARTRATE 50 MG PO TABS
50.0000 mg | ORAL_TABLET | Freq: Every day | ORAL | Status: DC
  Administered 2014-06-24: 50 mg via ORAL
  Filled 2014-06-23 (×2): qty 1

## 2014-06-23 MED ORDER — HYDROCHLOROTHIAZIDE 25 MG PO TABS
25.0000 mg | ORAL_TABLET | Freq: Every day | ORAL | Status: DC
  Administered 2014-06-24: 25 mg via ORAL
  Filled 2014-06-23: qty 1

## 2014-06-23 MED ORDER — ONDANSETRON HCL 4 MG/2ML IJ SOLN
INTRAMUSCULAR | Status: DC | PRN
  Administered 2014-06-23: 4 mg via INTRAVENOUS

## 2014-06-23 MED ORDER — METOCLOPRAMIDE HCL 5 MG/ML IJ SOLN: 10.0000 mg | Freq: Once | INTRAMUSCULAR | Status: DC | PRN

## 2014-06-23 MED ORDER — SCOPOLAMINE 1 MG/3DAYS TD PT72
1.0000 | MEDICATED_PATCH | Freq: Once | TRANSDERMAL | Status: DC
  Administered 2014-06-23: 1.5 mg via TRANSDERMAL

## 2014-06-23 MED ORDER — MENTHOL 3 MG MT LOZG
1.0000 | LOZENGE | OROMUCOSAL | Status: DC | PRN
Start: 2014-06-23 — End: 2014-06-25

## 2014-06-23 MED ORDER — LIDOCAINE HCL (CARDIAC) 20 MG/ML IV SOLN
INTRAVENOUS | Status: AC
  Filled 2014-06-23: qty 5

## 2014-06-23 MED ORDER — NEOSTIGMINE METHYLSULFATE 10 MG/10ML IV SOLN
INTRAVENOUS | Status: AC
  Filled 2014-06-23: qty 1

## 2014-06-23 MED ORDER — NEOSTIGMINE METHYLSULFATE 10 MG/10ML IV SOLN
INTRAVENOUS | Status: DC | PRN
  Administered 2014-06-23: 4 mg via INTRAVENOUS

## 2014-06-23 MED ORDER — SUCCINYLCHOLINE CHLORIDE 20 MG/ML IJ SOLN
INTRAMUSCULAR | Status: AC
  Filled 2014-06-23: qty 10

## 2014-06-23 MED ORDER — NALOXONE HCL 0.4 MG/ML IJ SOLN: 0.4000 mg | INTRAMUSCULAR | Status: DC | PRN

## 2014-06-23 MED ORDER — KETOROLAC TROMETHAMINE 30 MG/ML IJ SOLN
INTRAMUSCULAR | Status: AC
  Filled 2014-06-23: qty 1

## 2014-06-23 MED ORDER — BUPIVACAINE HCL (PF) 0.25 % IJ SOLN
INTRAMUSCULAR | Status: DC | PRN
Start: 2014-06-23 — End: 2014-06-23
  Administered 2014-06-23: 5 mL

## 2014-06-23 MED ORDER — DEXAMETHASONE SODIUM PHOSPHATE 4 MG/ML IJ SOLN
INTRAMUSCULAR | Status: AC
  Filled 2014-06-23: qty 1

## 2014-06-23 MED ORDER — HYDROMORPHONE HCL 1 MG/ML IJ SOLN
INTRAMUSCULAR | Status: DC | PRN
  Administered 2014-06-23: 1 mg via INTRAVENOUS

## 2014-06-23 MED ORDER — SUCCINYLCHOLINE CHLORIDE 20 MG/ML IJ SOLN
INTRAMUSCULAR | Status: DC | PRN
  Administered 2014-06-23: 100 mg via INTRAVENOUS

## 2014-06-23 MED ORDER — PHENYLEPHRINE HCL 10 MG/ML IJ SOLN
INTRAMUSCULAR | Status: DC | PRN
  Administered 2014-06-23: 40 ug via INTRAVENOUS

## 2014-06-23 MED ORDER — ROCURONIUM BROMIDE 100 MG/10ML IV SOLN
INTRAVENOUS | Status: AC
Start: 2014-06-23 — End: 2014-06-23
  Filled 2014-06-23: qty 1

## 2014-06-23 MED ORDER — LACTATED RINGERS IV SOLN
INTRAVENOUS | Status: DC
  Administered 2014-06-23: 17:00:00 via INTRAVENOUS

## 2014-06-23 MED ORDER — OXYCODONE-ACETAMINOPHEN 5-325 MG PO TABS
1.0000 | ORAL_TABLET | ORAL | Status: DC | PRN
  Administered 2014-06-24: 2 via ORAL
  Administered 2014-06-24: 1 via ORAL
  Administered 2014-06-24: 2 via ORAL
  Administered 2014-06-24: 1 via ORAL
  Filled 2014-06-23 (×2): qty 1
  Filled 2014-06-23 (×2): qty 2

## 2014-06-23 MED ORDER — HYDROMORPHONE HCL 1 MG/ML IJ SOLN
INTRAMUSCULAR | Status: AC
  Filled 2014-06-23: qty 1

## 2014-06-23 MED ORDER — SCOPOLAMINE 1 MG/3DAYS TD PT72
MEDICATED_PATCH | TRANSDERMAL | Status: AC
  Administered 2014-06-23: 1.5 mg via TRANSDERMAL
  Filled 2014-06-23: qty 1

## 2014-06-23 MED ORDER — PHENYLEPHRINE 40 MCG/ML (10ML) SYRINGE FOR IV PUSH (FOR BLOOD PRESSURE SUPPORT)
PREFILLED_SYRINGE | INTRAVENOUS | Status: AC
  Filled 2014-06-23: qty 5

## 2014-06-23 MED ORDER — DIPHENHYDRAMINE HCL 50 MG/ML IJ SOLN: 12.5000 mg | Freq: Four times a day (QID) | INTRAMUSCULAR | Status: DC | PRN

## 2014-06-23 SURGICAL SUPPLY — 51 items
ADH SKN CLS APL DERMABOND .7 (GAUZE/BANDAGES/DRESSINGS) ×6
BLADE SURG 10 STRL SS (BLADE) ×4 IMPLANT
BLADE SURG 11 STRL SS (BLADE) ×8 IMPLANT
CABLE HIGH FREQUENCY MONO STRZ (ELECTRODE) IMPLANT
CANISTER SUCT 3000ML (MISCELLANEOUS) ×4 IMPLANT
CLOTH BEACON ORANGE TIMEOUT ST (SAFETY) ×4 IMPLANT
COVER BACK TABLE 60X90IN (DRAPES) ×4 IMPLANT
DECANTER SPIKE VIAL GLASS SM (MISCELLANEOUS) IMPLANT
DERMABOND ADVANCED (GAUZE/BANDAGES/DRESSINGS) ×2
DERMABOND ADVANCED .7 DNX12 (GAUZE/BANDAGES/DRESSINGS) ×6 IMPLANT
DRSG COVADERM PLUS 2X2 (GAUZE/BANDAGES/DRESSINGS) ×8 IMPLANT
DRSG OPSITE POSTOP 3X4 (GAUZE/BANDAGES/DRESSINGS) ×2 IMPLANT
DRSG OPSITE POSTOP 4X10 (GAUZE/BANDAGES/DRESSINGS) ×2 IMPLANT
DURAPREP 26ML APPLICATOR (WOUND CARE) ×4 IMPLANT
ELECT BLADE 6.5 EXT (BLADE) ×2 IMPLANT
ELECT LIGASURE LONG (ELECTRODE) IMPLANT
ELECT LIGASURE SHORT 9 REUSE (ELECTRODE) IMPLANT
ELECT REM PT RETURN 9FT ADLT (ELECTROSURGICAL)
ELECTRODE REM PT RTRN 9FT ADLT (ELECTROSURGICAL) IMPLANT
GLOVE BIO SURGEON STRL SZ 6.5 (GLOVE) ×4 IMPLANT
GLOVE BIOGEL PI IND STRL 6.5 (GLOVE) ×3 IMPLANT
GLOVE BIOGEL PI IND STRL 7.0 (GLOVE) ×9 IMPLANT
GLOVE BIOGEL PI INDICATOR 6.5 (GLOVE) ×1
GLOVE BIOGEL PI INDICATOR 7.0 (GLOVE) ×3
GOWN STRL REUS W/ TWL LRG LVL3 (GOWN DISPOSABLE) ×21 IMPLANT
GOWN STRL REUS W/TWL LRG LVL3 (GOWN DISPOSABLE) ×28
NS IRRIG 1000ML POUR BTL (IV SOLUTION) ×4 IMPLANT
PACK LAVH (CUSTOM PROCEDURE TRAY) ×4 IMPLANT
PAD TRENDELENBURG OR TABLE (MISCELLANEOUS) ×4 IMPLANT
PROTECTOR NERVE ULNAR (MISCELLANEOUS) ×4 IMPLANT
SEALER TISSUE G2 CVD JAW 45CM (ENDOMECHANICALS) ×4 IMPLANT
SET IRRIG TUBING LAPAROSCOPIC (IRRIGATION / IRRIGATOR) IMPLANT
SLEEVE XCEL OPT CAN 5 100 (ENDOMECHANICALS) IMPLANT
SPONGE LAP 18X18 X RAY DECT (DISPOSABLE) ×4 IMPLANT
SUT MNCRL 0 MO-4 VIOLET 18 CR (SUTURE) ×6 IMPLANT
SUT MNCRL 0 VIOLET 6X18 (SUTURE) ×3 IMPLANT
SUT MON AB 2-0 CT1 36 (SUTURE) ×4 IMPLANT
SUT MON AB-0 CT1 36 (SUTURE) ×4 IMPLANT
SUT MONOCRYL 0 6X18 (SUTURE) ×1
SUT MONOCRYL 0 MO 4 18  CR/8 (SUTURE) ×2
SUT PDS AB 0 CTX 60 (SUTURE) ×2 IMPLANT
SUT VIC AB 3-0 PS2 18 (SUTURE) ×4
SUT VIC AB 3-0 PS2 18XBRD (SUTURE) ×3 IMPLANT
SUT VICRYL 0 UR6 27IN ABS (SUTURE) ×4 IMPLANT
TOWEL OR 17X24 6PK STRL BLUE (TOWEL DISPOSABLE) ×8 IMPLANT
TRAY FOLEY CATH 14FR (SET/KITS/TRAYS/PACK) ×4 IMPLANT
TROCAR 12M 150ML BLUNT (TROCAR) ×2 IMPLANT
TROCAR OPTI TIP 5M 100M (ENDOMECHANICALS) ×4 IMPLANT
TROCAR XCEL NON-BLD 11X100MML (ENDOMECHANICALS) ×4 IMPLANT
WARMER LAPAROSCOPE (MISCELLANEOUS) ×4 IMPLANT
WATER STERILE IRR 1000ML POUR (IV SOLUTION) ×4 IMPLANT

## 2014-06-23 NOTE — Anesthesia Postprocedure Evaluation (Signed)
Anesthesia Post Note  Patient: Melissa Ray  Procedure(s) Performed: Procedure(s): ATTEMPTED LAPAROSCOPIC ASSISTED VAGINAL HYSTERECTOMY  (N/A) HYSTERECTOMY ABDOMINAL WITH Bilateral  salpingectomy (Bilateral)  Anesthesia type: General  Patient location: [facility]  Post pain: Pain level controlled  Post assessment: Post-op Vital signs reviewed  Last Vitals: BP 132/62  Pulse 68  Temp(Src) 37.1 C (Oral)  Resp 20  Ht 5\' 5"  (1.651 m)  Wt 220 lb (99.791 kg)  BMI 36.61 kg/m2  SpO2 98%  Post vital signs: Reviewed  Level of consciousness: awake  Complications: No apparent anesthesia complications

## 2014-06-23 NOTE — Transfer of Care (Signed)
Immediate Anesthesia Transfer of Care Note  Patient: Melissa Ray  Procedure(s) Performed: Procedure(s): ATTEMPTED LAPAROSCOPIC ASSISTED VAGINAL HYSTERECTOMY  (N/A) HYSTERECTOMY ABDOMINAL WITH Bilateral  salpingectomy (Bilateral)  Patient Location: PACU  Anesthesia Type:General  Level of Consciousness: awake, alert  and oriented  Airway & Oxygen Therapy: Patient Spontanous Breathing and Patient connected to nasal cannula oxygen  Post-op Assessment: Report given to PACU RN and Post -op Vital signs reviewed and stable  Post vital signs: Reviewed and stable  Complications: No apparent anesthesia complications

## 2014-06-23 NOTE — Op Note (Signed)
06/23/2014  9:36 AM  PATIENT:  Melissa Ray  33 y.o. female  PRE-OPERATIVE DIAGNOSIS:  menorrhagia, history of hyperplasia  POST-OPERATIVE DIAGNOSIS:  menorrhagia,history of hyperplasia  PROCEDURE:  Procedure(s): ATTEMPTED LAPAROSCOPIC ASSISTED VAGINAL HYSTERECTOMY  (N/A) HYSTERECTOMY ABDOMINAL WITH Bilateral  salpingectomy (Bilateral)  SURGEON:  Surgeon(s) and Role:    * Marylynn Pearson, MD - Primary    * Allena Katz, MD - Assisting   ANESTHESIA:   general  EBL:  Total I/O In: 2000 [I.V.:2000] Out: 230 [Urine:100; Blood:130]  BLOOD ADMINISTERED:none  DRAINS: foley   LOCAL MEDICATIONS USED:  MARCAINE     SPECIMEN:  Source of Specimen:  uterus and bilateral fallopian tubes  DISPOSITION OF SPECIMEN:  PATHOLOGY  COUNTS:  YES  TOURNIQUET:  * No tourniquets in log *  DICTATION: .Other Dictation: Dictation Number pending  PLAN OF CARE: Admit to inpatient   PATIENT DISPOSITION:  PACU - hemodynamically stable.   Delay start of Pharmacological VTE agent (>24hrs) due to surgical blood loss or risk of bleeding: no

## 2014-06-23 NOTE — Anesthesia Postprocedure Evaluation (Signed)
  Anesthesia Post-op Note  Patient: Melissa Ray  Procedure(s) Performed: Procedure(s): ATTEMPTED LAPAROSCOPIC ASSISTED VAGINAL HYSTERECTOMY  (N/A) HYSTERECTOMY ABDOMINAL WITH Bilateral  salpingectomy (Bilateral)  Patient Location: PACU  Anesthesia Type:General  Level of Consciousness: awake, alert  and oriented  Airway and Oxygen Therapy: Patient Spontanous Breathing  Post-op Pain: mild  Post-op Assessment: Post-op Vital signs reviewed, Patient's Cardiovascular Status Stable, Respiratory Function Stable, Patent Airway, No signs of Nausea or vomiting and Pain level controlled  Post-op Vital Signs: Reviewed and stable  Last Vitals:  Filed Vitals:   06/23/14 1015  BP: 148/79  Pulse: 76  Temp:   Resp: 14    Complications: No apparent anesthesia complications

## 2014-06-23 NOTE — Addendum Note (Signed)
Addendum created 06/23/14 1535 by Flossie Dibble, CRNA   Modules edited: Notes Section   Notes Section:  File: [id number]

## 2014-06-24 ENCOUNTER — Encounter (HOSPITAL_COMMUNITY): Payer: Self-pay | Admitting: Obstetrics and Gynecology

## 2014-06-24 LAB — CBC
HCT: 26.9 % — ABNORMAL LOW (ref 36.0–46.0)
Hemoglobin: 8 g/dL — ABNORMAL LOW (ref 12.0–15.0)
MCH: 20.9 pg — AB (ref 26.0–34.0)
MCHC: 29.7 g/dL — AB (ref 30.0–36.0)
MCV: 70.4 fL — AB (ref 78.0–100.0)
Platelets: 365 10*3/uL (ref 150–400)
RBC: 3.82 MIL/uL — ABNORMAL LOW (ref 3.87–5.11)
RDW: 17.6 % — AB (ref 11.5–15.5)
WBC: 9.9 10*3/uL (ref 4.0–10.5)

## 2014-06-24 LAB — COMPREHENSIVE METABOLIC PANEL
ALBUMIN: 2.8 g/dL — AB (ref 3.5–5.2)
ALT: 17 U/L (ref 0–35)
AST: 18 U/L (ref 0–37)
Alkaline Phosphatase: 43 U/L (ref 39–117)
Anion gap: 12 (ref 5–15)
BUN: 8 mg/dL (ref 6–23)
CALCIUM: 8.8 mg/dL (ref 8.4–10.5)
CO2: 27 mEq/L (ref 19–32)
CREATININE: 0.63 mg/dL (ref 0.50–1.10)
Chloride: 101 mEq/L (ref 96–112)
GFR calc Af Amer: >90 mL/min (ref >90)
Glucose, Bld: 122 mg/dL — ABNORMAL HIGH (ref 70–99)
Potassium: 3.4 mEq/L — ABNORMAL LOW (ref 3.7–5.3)
Sodium: 140 mEq/L (ref 137–147)
TOTAL PROTEIN: 5.9 g/dL — AB (ref 6.0–8.3)
Total Bilirubin: 0.6 mg/dL (ref 0.3–1.2)

## 2014-06-24 LAB — GLUCOSE, CAPILLARY
GLUCOSE-CAPILLARY: 113 mg/dL — AB (ref 70–99)
GLUCOSE-CAPILLARY: 106 mg/dL — AB (ref 70–99)
Glucose-Capillary: 120 mg/dL — ABNORMAL HIGH (ref 70–99)
Glucose-Capillary: 120 mg/dL — ABNORMAL HIGH (ref 70–99)

## 2014-06-24 NOTE — Progress Notes (Signed)
1 Day Post-Op Procedure(s) (LRB): ATTEMPTED LAPAROSCOPIC ASSISTED VAGINAL HYSTERECTOMY  (N/A) HYSTERECTOMY ABDOMINAL WITH Bilateral  salpingectomy (Bilateral)  Subjective: Patient reports incisional pain, tolerating PO and no problems voiding.    Objective: I have reviewed patient's vital signs, intake and output, medications and labs.  General: alert and cooperative GI: normal findings: soft, non-tender Extremities: extremities normal, atraumatic, no cyanosis or edema Vaginal Bleeding: minimal  Assessment: s/p Procedure(s): ATTEMPTED LAPAROSCOPIC ASSISTED VAGINAL HYSTERECTOMY  (N/A) HYSTERECTOMY ABDOMINAL WITH Bilateral  salpingectomy (Bilateral): stable, unstable and tolerating diet  Plan: Advance diet Encourage ambulation Advance to PO medication Discontinue IV fluids  LOS: 1 day    Melissa Ray 06/24/2014, 9:03 AM

## 2014-06-25 LAB — GLUCOSE, CAPILLARY: Glucose-Capillary: 119 mg/dL — ABNORMAL HIGH (ref 70–99)

## 2014-06-25 MED ORDER — OXYCODONE-ACETAMINOPHEN 5-325 MG PO TABS: 1.0000 | ORAL_TABLET | ORAL | Status: DC | PRN

## 2014-06-25 MED ORDER — IBUPROFEN 600 MG PO TABS: 600.0000 mg | ORAL_TABLET | Freq: Four times a day (QID) | ORAL | Status: DC | PRN

## 2014-06-25 NOTE — Progress Notes (Signed)
Pt is discharged in the care of husband. Downstairs per ambulatory.Discharge instructions with Rx were given to pt. Questions were asked and answered. No equipment needed for home use. Stable. Abdominal dressing are clean and dry.

## 2014-06-25 NOTE — Discharge Summary (Signed)
Physician Discharge Summary  Patient ID: Melissa Ray MRN: [id number] DOB/AGE: Nov 02, 1980 33 y.o.  Admit date: 06/23/2014 Discharge date: 06/25/2014  Admission Diagnoses:  Menorrhagia, h/o endometrial hyperplasia with foci of adenocarcinoma  Discharge Diagnoses:  Active Problems:   Menorrhagia   Discharged Condition: stable  Hospital Course: Pt was admitted for post op care.  Initially her pain was controlled with IV pain medications.  Once she was tolerating PO, she was given PO meds and her IV was discontinued.  Foley was discontinued on POD 1 and she was ambulating and voiding without difficulty.  Consults: None  Significant Diagnostic Studies: labs: cbc  Treatments: IV hydration and surgery: TAH  Discharge Exam: Blood pressure 143/73, pulse 84, temperature 98.2 F (36.8 C), temperature source Oral, resp. rate 18, height 5\' 5"  (1.651 m), weight 220 lb (99.791 kg), SpO2 99.00%. General appearance: alert and cooperative GI: normal findings: soft, non-tender Incision/Wound:clean, bandage intact  Disposition: 01-Home or Self Care     Medication List    STOP taking these medications       ALPRAZolam 0.25 MG tablet  Commonly known as:  XANAX     megestrol 40 MG tablet  Commonly known as:  MEGACE      TAKE these medications       ibuprofen 600 MG tablet  Commonly known as:  ADVIL,MOTRIN  Take 1 tablet (600 mg total) by mouth every 6 (six) hours as needed.     loratadine 10 MG tablet  Commonly known as:  CLARITIN  Take 10 mg by mouth daily.     metFORMIN 500 MG 24 hr tablet  Commonly known as:  GLUCOPHAGE-XR  Take 500 mg by mouth 2 (two) times daily.     metoprolol-hydrochlorothiazide 50-25 MG per tablet  Commonly known as:  LOPRESSOR HCT  Take 1 tablet by mouth every morning.     oxyCODONE-acetaminophen 5-325 MG per tablet  Commonly known as:  PERCOCET/ROXICET  Take 1-2 tablets by mouth every 4 (four) hours as needed for severe pain (moderate to  severe pain (when tolerating fluids)).     prenatal multivitamin Tabs tablet  Take 1 tablet by mouth daily.           Follow-up Information   Schedule an appointment as soon as possible for a visit in 2 weeks to follow up.      Signed: Vanisha Whiten 06/25/2014, 8:33 AM

## 2017-06-11 ENCOUNTER — Encounter (HOSPITAL_COMMUNITY): Payer: Self-pay | Admitting: Emergency Medicine

## 2017-06-11 ENCOUNTER — Ambulatory Visit (HOSPITAL_COMMUNITY)
Admission: EM | Admit: 2017-06-11 | Discharge: 2017-06-11 | Disposition: A | Discharge status: Home / Self Care | Payer: BC Managed Care – PPO | Attending: Emergency Medicine | Admitting: Emergency Medicine

## 2017-06-11 ENCOUNTER — Emergency Department (HOSPITAL_COMMUNITY)
Admission: EM | Admit: 2017-06-11 | Discharge: 2017-06-11 | Disposition: A | Discharge status: Home / Self Care | Payer: BC Managed Care – PPO | Attending: Emergency Medicine | Admitting: Emergency Medicine

## 2017-06-11 ENCOUNTER — Emergency Department (HOSPITAL_COMMUNITY): Payer: BC Managed Care – PPO

## 2017-06-11 DIAGNOSIS — M549 Dorsalgia, unspecified: Secondary | ICD-10-CM | POA: Diagnosis not present

## 2017-06-11 DIAGNOSIS — R197 Diarrhea, unspecified: Secondary | ICD-10-CM | POA: Diagnosis not present

## 2017-06-11 DIAGNOSIS — R000 Tachycardia, unspecified: Secondary | ICD-10-CM

## 2017-06-11 DIAGNOSIS — R5383 Other fatigue: Secondary | ICD-10-CM | POA: Insufficient documentation

## 2017-06-11 DIAGNOSIS — R0789 Other chest pain: Secondary | ICD-10-CM | POA: Insufficient documentation

## 2017-06-11 DIAGNOSIS — F419 Anxiety disorder, unspecified: Secondary | ICD-10-CM | POA: Diagnosis not present

## 2017-06-11 DIAGNOSIS — E119 Type 2 diabetes mellitus without complications: Secondary | ICD-10-CM | POA: Insufficient documentation

## 2017-06-11 DIAGNOSIS — R9431 Abnormal electrocardiogram [ECG] [EKG]: Secondary | ICD-10-CM | POA: Diagnosis not present

## 2017-06-11 DIAGNOSIS — Z79899 Other long term (current) drug therapy: Secondary | ICD-10-CM | POA: Diagnosis not present

## 2017-06-11 DIAGNOSIS — I10 Essential (primary) hypertension: Secondary | ICD-10-CM | POA: Diagnosis not present

## 2017-06-11 DIAGNOSIS — R002 Palpitations: Secondary | ICD-10-CM

## 2017-06-11 LAB — CBC
HCT: 41.3 % (ref 36.0–46.0)
Hemoglobin: 14.3 g/dL (ref 12.0–15.0)
MCH: 27.7 pg (ref 26.0–34.0)
MCHC: 34.6 g/dL (ref 30.0–36.0)
MCV: 79.9 fL (ref 78.0–100.0)
Platelets: 341 10*3/uL (ref 150–400)
RBC: 5.17 MIL/uL — ABNORMAL HIGH (ref 3.87–5.11)
RDW: 12.8 % (ref 11.5–15.5)
WBC: 12.6 10*3/uL — AB (ref 4.0–10.5)

## 2017-06-11 LAB — BASIC METABOLIC PANEL
Anion gap: 12 (ref 5–15)
BUN: 7 mg/dL (ref 6–20)
CHLORIDE: 102 mmol/L (ref 101–111)
CO2: 23 mmol/L (ref 22–32)
Calcium: 9.7 mg/dL (ref 8.9–10.3)
Creatinine, Ser: 0.52 mg/dL (ref 0.44–1.00)
GFR calc Af Amer: >60 mL/min (ref >60)
GFR calc non Af Amer: >60 mL/min (ref >60)
Glucose, Bld: 158 mg/dL — ABNORMAL HIGH (ref 65–99)
Potassium: 3.1 mmol/L — ABNORMAL LOW (ref 3.5–5.1)
SODIUM: 137 mmol/L (ref 135–145)

## 2017-06-11 LAB — I-STAT TROPONIN, ED: TROPONIN I, POC: 0 ng/mL (ref 0.00–0.08)

## 2017-06-11 LAB — T4, FREE: Free T4: 1.05 ng/dL (ref 0.61–1.12)

## 2017-06-11 LAB — TSH: TSH: 4.291 u[IU]/mL (ref 0.350–4.500)

## 2017-06-11 LAB — CBG MONITORING, ED: GLUCOSE-CAPILLARY: 175 mg/dL — AB (ref 65–99)

## 2017-06-11 LAB — D-DIMER, QUANTITATIVE (NOT AT ARMC)

## 2017-06-11 MED ORDER — POTASSIUM CHLORIDE CRYS ER 20 MEQ PO TBCR
40.0000 meq | EXTENDED_RELEASE_TABLET | Freq: Once | ORAL | Status: AC
  Administered 2017-06-11: 40 meq via ORAL
  Filled 2017-06-11: qty 2

## 2017-06-11 MED ORDER — SODIUM CHLORIDE 0.9 % IV BOLUS (SEPSIS)
1000.0000 mL | Freq: Once | INTRAVENOUS | Status: AC
  Administered 2017-06-11: 1000 mL via INTRAVENOUS

## 2017-06-11 NOTE — ED Notes (Signed)
PA at the bedside.

## 2017-06-11 NOTE — Discharge Instructions (Signed)
As we discussed, your labs today overall looked very good. Your potassium was a little low but we have replaced this. Try to rest, relax, make sure you are drinking plenty of fluids and eating regular meals. Follow-up with your doctor on Tuesday as scheduled, copies of your labs and x-ray are on the back for them to review Return to the ED for new or worsening symptoms.

## 2017-06-11 NOTE — ED Provider Notes (Signed)
[place]    CSN: [id number] Arrival date & time: 06/11/17  1738     History   Chief Complaint Chief Complaint  Patient presents with  . Hypertension    HPI Melissa Ray is a 36 y.o. female.   36 year old female presents with chest tightness and more fatigue that started about 6 days ago (last Wednesday). She had noticed her blood pressure was elevated (150/100) for the past few days and has increased today to 180/102 at home despite taking her BP medication as prescribed. She has also noticed her heart rate has been in the low 100's. 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 She also is feeling "cold" but has a slightly elevated temp today. She is also experiencing some diarrhea but denies any nasal congestion, cough or abdominal pain. She has not taken any additional medication for symptoms. She also has a history of elevated sugars. Has not taken a finger stick glucose reading today but has not eaten much due to the recent diarrhea. She has a history of anxiety as well as a strong family history of heart disease. Her brother died of a heart attack at age 30.    The history is provided by the patient and the spouse.    Past Medical History:  Diagnosis Date  . Anemia    last transfusion oct 2013  . Anxiety    situational  . Complex endometrial hyperplasia    DX SINCE AGE 52  . Complication of anesthesia 2003   during D&C, pt states her temp spiked during surgery  . Diabetes mellitus without complication (HCC)    borderline  . Hypertension   . Menorrhagia with irregular cycle   . Polycystic disease, ovaries   . Prediabetes   . Shortness of breath    seasonal allergies  . Wears contact lenses     Patient Active Problem List   Diagnosis Date Noted  . Menorrhagia 06/23/2014  . Complex atypical endometrial hyperplasia 06/11/2012    Past Surgical History:  Procedure Laterality Date  . ABDOMINAL  HYSTERECTOMY Bilateral 06/23/2014   Procedure: HYSTERECTOMY ABDOMINAL WITH Bilateral  salpingectomy;  Surgeon: Marylynn Pearson, MD;  Location: [facility];  Service: Gynecology;  Laterality: Bilateral;  . DILATION AND CURETTAGE OF UTERUS  06/28/2012   Procedure: DILATATION AND CURETTAGE;  Surgeon: Allena Katz, MD;  Location: [facility];  Service: Gynecology;  Laterality: N/A;  SUCTION  USED  . [place] OF UTERUS  2003   &  2008  . HYSTEROSCOPY W/D&C  05/14/2012   Procedure: DILATATION AND CURETTAGE /HYSTEROSCOPY;  Surgeon: Marylynn Pearson, MD;  Location: [facility];  Service: Gynecology;  Laterality: N/A;  . HYSTEROSCOPY W/D&C N/A 05/06/2013   Procedure: DILATATION AND CURETTAGE /HYSTEROSCOPY;  Surgeon: Marylynn Pearson, MD;  Location: [facility];  Service: Gynecology;  Laterality: N/A;  . HYSTEROSCOPY W/D&C N/A 11/27/2013   Procedure: DILATATION AND CURETTAGE;  Surgeon: Marylynn Pearson, MD;  Location: [facility];  Service: Gynecology;  Laterality: N/A;  . LAPAROSCOPIC ASSISTED VAGINAL HYSTERECTOMY N/A 06/23/2014   Procedure: ATTEMPTED LAPAROSCOPIC ASSISTED VAGINAL HYSTERECTOMY ;  Surgeon: Marylynn Pearson, MD;  Location: [facility];  Service: Gynecology;  Laterality: N/A;  . TONSILLECTOMY  as child  . WISDOM TOOTH EXTRACTION  as teen    OB History    Gravida Para Term Preterm AB Living   0  SAB TAB Ectopic Multiple Live Births                   Home Medications    Prior to Admission medications   Medication Sig Start Date End Date Taking? Authorizing Provider  metoprolol-hydrochlorothiazide (LOPRESSOR HCT) 50-25 MG per tablet Take 1 tablet by mouth every morning.     [provider]  Prenatal Vit-Fe Fumarate-FA (PRENATAL MULTIVITAMIN) TABS tablet Take 1 tablet by mouth daily.    [provider]    Family History Family History  Problem Relation Age of Onset  . Heart disease Mother   . Diabetes Mother   . Heart disease Father   . Diabetes  Father   . Cancer Father   . Polycystic ovary syndrome Sister     Social History Social History  Substance Use Topics  . Smoking status: Never Smoker  . Smokeless tobacco: Never Used  . Alcohol use No     Allergies   Patient has no known allergies.   Review of Systems Review of Systems   Physical Exam Triage Vital Signs ED Triage Vitals [06/11/17 1811]  Enc Vitals Group     BP (!) 182/110     Pulse Rate (!) 118     Resp 18     Temp 99.3 F (37.4 C)     Temp Source Oral     SpO2 99 %     Weight      Height      Head Circumference      Peak Flow      Pain Score 5     Pain Loc      Pain Edu?      Excl. in [place]?    No data found.   Updated Vital Signs BP (!) 182/110 (BP Location: Right Arm)   Pulse (!) 118   Temp 99.3 F (37.4 C) (Oral)   Resp 18   SpO2 99%   Visual Acuity Right Eye Distance:   Left Eye Distance:   Bilateral Distance:    Right Eye Near:   Left Eye Near:    Bilateral Near:     Physical Exam  Constitutional: She is oriented to person, place, and time. She appears well-developed and well-nourished. She appears distressed.  She appears anxious but is breathing normally.   HENT:  Head: Normocephalic and atraumatic.  Right Ear: Hearing, tympanic membrane, external ear and ear canal normal.  Left Ear: Hearing, tympanic membrane, external ear and ear canal normal.  Nose: Nose normal.  Mouth/Throat: Uvula is midline, oropharynx is clear and moist and mucous membranes are normal.  Eyes: Conjunctivae and EOM are normal.  Neck: Normal range of motion. Neck supple.  Cardiovascular: Normal heart sounds.  Tachycardia present.   Pulmonary/Chest: Breath sounds normal. No respiratory distress. She has no decreased breath sounds. She has no wheezes. She has no rhonchi.  Musculoskeletal: Normal range of motion.  Neurological: She is alert and oriented to person, place, and time.  Skin: Skin is warm and dry. Capillary refill takes less than 2  seconds.  Psychiatric: Her speech is normal and behavior is normal. Judgment and thought content normal. Her mood appears anxious. Cognition and memory are normal.     UC Treatments / Results  Labs (all labs ordered are listed, but only abnormal results are displayed) Labs Reviewed - No data to display  EKG  EKG Interpretation None       Radiology No results found.  Procedures ED  EKG Date/Time: 06/11/2017 8:23 PM Performed by: Melvyn Neth, ANN BERRY Authorized by: Zoe Lan BERRY   ECG reviewed by ED Physician in the absence of a cardiologist: yes   Previous ECG:    Previous ECG:  Unavailable Interpretation:    Interpretation: abnormal   Rate:    ECG rate:  129   ECG rate assessment: tachycardic   QRS:    QRS intervals:  Wide Conduction:    Conduction: abnormal     Abnormal conduction: non-specific intraventricular conduction delay   ST segments:    ST segments:  Abnormal T waves:    T waves: non-specific   Other findings:    Other findings: prolonged qTc interval   Comments:     Abnormal EKG- reviewed by Dr. Melynda Ripple. Recommend further evaluation at the ER.    (including critical care time)  Medications Ordered in UC Medications - No data to display   Initial Impression / Assessment and Plan / UC Course  I have reviewed the triage vital signs and the nursing notes.  Pertinent labs & imaging results that were available during my care of the patient were reviewed by me and considered in my medical decision making (see chart for details).    EKG was reviewed by Dr. Alphonzo Cruise. Due to non-specific changes and tachycardia, recommend further evaluation at the ER. Discussed results with patient and husband. They understand and agree with plan. Patient will go to ER now- husband will accompany her to the ER.   Final Clinical Impressions(s) / UC Diagnoses   Final diagnoses:  Tachycardia, unspecified  Essential hypertension  Other chest pain  Nonspecific  abnormal electrocardiogram (ECG) (EKG)    New Prescriptions Discharge Medication List as of 06/11/2017  8:12 PM       Controlled Substance Prescriptions [place] Controlled Substance Registry consulted? Not Applicable   Katy Apo, NP 06/11/17 2028

## 2017-06-11 NOTE — ED Triage Notes (Signed)
Pt here for htn today; pt sts recent GI upset yesterday; pt noted to be tachy but sts hx of same with anxiety; pt appears anxious

## 2017-06-11 NOTE — ED Notes (Signed)
PT states understanding of care given, follow up care. PT ambulated from ED to car with a steady gait.  

## 2017-06-11 NOTE — ED Triage Notes (Signed)
Pt presents to ED for tachycardia, hypertension, and malaise.  Pt states she has had some chills today.  Pt c/o some pressure in her chest.

## 2017-06-11 NOTE — Discharge Instructions (Signed)
Due to the chest pressure, increased blood pressure, palpitations, and abnormal changes on your EKG, recommend go to ER now for further evaluation.
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
## 2019-05-13 DIAGNOSIS — E119 Type 2 diabetes mellitus without complications: Secondary | ICD-10-CM | POA: Insufficient documentation

## 2019-08-28 ENCOUNTER — Other Ambulatory Visit: Payer: Self-pay

## 2019-08-28 ENCOUNTER — Ambulatory Visit
Admission: EM | Admit: 2019-08-28 | Discharge: 2019-08-28 | Disposition: A | Discharge status: Home / Self Care | Payer: BC Managed Care – PPO

## 2019-08-28 DIAGNOSIS — U071 COVID-19: Secondary | ICD-10-CM

## 2019-08-28 DIAGNOSIS — R6889 Other general symptoms and signs: Secondary | ICD-10-CM

## 2019-08-28 LAB — POC SARS CORONAVIRUS 2 AG -  ED: SARS Coronavirus 2 Ag: POSITIVE — AB

## 2019-08-28 NOTE — ED Provider Notes (Signed)
[facility]   [id number] 08/28/19 Arrival Time: 0813   CC: COVID symptoms  SUBJECTIVE: History from: patient.  [facility] is a 38 y.o. female who presents with frontal headache, fatigue, body aches, chills, night sweats, and fever, tmax of 101, that began 2 days ago.  Denies sick exposure to COVID, flu or strep.  Denies recent travel.  Has tried tylenol with relief.  Symptoms are made worse at night.   Reports previous symptoms in the past related to flu.  Complains of mild dry cough as well.  Denies  sinus pain, rhinorrhea, sore throat, SOB, wheezing, chest pain, nausea, vomiting, changes in bowel or bladder habits.     ROS: As per HPI.  All other pertinent ROS negative.     Past Medical History:  Diagnosis Date  . Anemia    last transfusion oct 2013  . Anxiety    situational  . Complex endometrial hyperplasia    DX SINCE AGE 69  . Complication of anesthesia 2003   during D&C, pt states her temp spiked during surgery  . Diabetes mellitus without complication (HCC)    borderline  . Hypertension   . Menorrhagia with irregular cycle   . Polycystic disease, ovaries   . Prediabetes   . Shortness of breath    seasonal allergies  . Wears contact lenses    Past Surgical History:  Procedure Laterality Date  . ABDOMINAL HYSTERECTOMY Bilateral 06/23/2014   Procedure: HYSTERECTOMY ABDOMINAL WITH Bilateral  salpingectomy;  Surgeon: Marylynn Pearson, MD;  Location: [facility];  Service: Gynecology;  Laterality: Bilateral;  . DILATION AND CURETTAGE OF UTERUS  06/28/2012   Procedure: DILATATION AND CURETTAGE;  Surgeon: Allena Katz, MD;  Location: [facility];  Service: Gynecology;  Laterality: N/A;  SUCTION  USED  . [place] OF UTERUS  2003   &  2008  . HYSTEROSCOPY WITH D & C  05/14/2012   Procedure: DILATATION AND CURETTAGE /HYSTEROSCOPY;  Surgeon: Marylynn Pearson, MD;  Location: [facility];  Service: Gynecology;  Laterality: N/A;  . HYSTEROSCOPY WITH D & C  N/A 05/06/2013   Procedure: DILATATION AND CURETTAGE /HYSTEROSCOPY;  Surgeon: Marylynn Pearson, MD;  Location: [facility];  Service: Gynecology;  Laterality: N/A;  . HYSTEROSCOPY WITH D & C N/A 11/27/2013   Procedure: DILATATION AND CURETTAGE;  Surgeon: Marylynn Pearson, MD;  Location: [facility];  Service: Gynecology;  Laterality: N/A;  . LAPAROSCOPIC ASSISTED VAGINAL HYSTERECTOMY N/A 06/23/2014   Procedure: ATTEMPTED LAPAROSCOPIC ASSISTED VAGINAL HYSTERECTOMY ;  Surgeon: Marylynn Pearson, MD;  Location: [facility];  Service: Gynecology;  Laterality: N/A;  . TONSILLECTOMY  as child  . WISDOM TOOTH EXTRACTION  as teen   No Known Allergies No current facility-administered medications on file prior to encounter.   Current Outpatient Medications on File Prior to Encounter  Medication Sig Dispense Refill  . lisinopril (ZESTRIL) 10 MG tablet Take 10 mg by mouth daily.    . metoprolol-hydrochlorothiazide (LOPRESSOR HCT) 50-25 MG per tablet Take 1 tablet by mouth every morning.      Social History   Socioeconomic History  . Marital status: Married    Spouse name: Not on file  . Number of children: Not on file  . Years of education: Not on file  . Highest education level: Not on file  Occupational History  . Not on file  Tobacco Use  . Smoking status: Never Smoker  . Smokeless tobacco: Never Used  Substance and Sexual Activity  .  Alcohol use: No  . Drug use: No  . Sexual activity: Yes    Birth control/protection: None  Other Topics Concern  . Not on file  Social History Narrative  . Not on file   Social Determinants of Health   Financial Resource Strain:   . Difficulty of Paying Living Expenses: Not on file  Food Insecurity:   . Worried About Charity fundraiser in the Last Year: Not on file  . Ran Out of Food in the Last Year: Not on file  Transportation Needs:   . Lack of Transportation (Medical): Not on file  . Lack of Transportation (Non-Medical): Not on file  Physical  Activity:   . Days of Exercise per Week: Not on file  . Minutes of Exercise per Session: Not on file  Stress:   . Feeling of Stress : Not on file  Social Connections:   . Frequency of Communication with Friends and Family: Not on file  . Frequency of Social Gatherings with Friends and Family: Not on file  . Attends Religious Services: Not on file  . Active Member of Clubs or Organizations: Not on file  . Attends Archivist Meetings: Not on file  . Marital Status: Not on file  Intimate Partner Violence:   . Fear of Current or Ex-Partner: Not on file  . Emotionally Abused: Not on file  . Physically Abused: Not on file  . Sexually Abused: Not on file   Family History  Problem Relation Age of Onset  . Heart disease Mother   . Diabetes Mother   . Heart disease Father   . Diabetes Father   . Cancer Father   . Polycystic ovary syndrome Sister     OBJECTIVE:  Vitals:   08/28/19 0828  BP: (!) 130/95  Pulse: (!) 115  Resp: 17  Temp: 99.4 F (37.4 C)  TempSrc: Oral  SpO2: 96%     General appearance: alert; appears mildly fatigued, but nontoxic; speaking in full sentences and tolerating own secretions HEENT: NCAT; Ears: EACs clear, TMs pearly gray; Eyes: PERRL.  EOM grossly intact. Nose: nares patent without rhinorrhea, Throat: oropharynx clear, tonsils non erythematous or enlarged, uvula midline  Neck: supple without LAD Lungs: unlabored respirations, symmetrical air entry; cough: absent; no respiratory distress; CTAB Heart: Tachycardia Skin: warm and dry Psychological: alert and cooperative; normal mood and affect  LABS:  Results for orders placed or performed during the hospital encounter of 08/28/19 (from the past 24 hour(s))  POC SARS Coronavirus 2 Ag-ED - Nasal Swab (BD Veritor Kit)     Status: Abnormal   Collection Time: 08/28/19  8:58 AM  Result Value Ref Range   SARS Coronavirus 2 Ag Positive (A) Negative     ASSESSMENT & PLAN:  1. COVID-19 virus  infection   2. Flu-like symptoms    COVID test was positive You should remain isolated in your home for 10 days from symptom onset AND greater than 72 hours after symptoms resolution (absence of fever without the use of fever-reducing medication and improvement in respiratory symptoms), whichever is longer Get plenty of rest and push fluids Use zyrtec for nasal congestion, runny nose, and/or sore throat Use flonase for nasal congestion and runny nose Use medications daily for symptom relief Use OTC medications like ibuprofen or tylenol as needed fever or pain Follow up with PCP in 1-2 days via phone or e-visit for recheck and to ensure symptoms are improving Call or go to the ED if  you have any new or worsening symptoms such as fever, worsening cough, shortness of breath, chest tightness, chest pain, turning blue, changes in mental status, etc...    Reviewed expectations re: course of current medical issues. Questions answered. Outlined signs and symptoms indicating need for more acute intervention. Patient verbalized understanding. After Visit Summary given.         Lestine Box, PA-C 08/28/19 [phone]

## 2019-08-28 NOTE — Discharge Instructions (Signed)
COVID test was positive You should remain isolated in your home for 10 days from symptom onset AND greater than 72 hours after symptoms resolution (absence of fever without the use of fever-reducing medication and improvement in respiratory symptoms), whichever is longer Get plenty of rest and push fluids Use zyrtec for nasal congestion, runny nose, and/or sore throat Use flonase for nasal congestion and runny nose Use medications daily for symptom relief Use OTC medications like ibuprofen or tylenol as needed fever or pain Follow up with PCP in 1-2 days via phone or e-visit for recheck and to ensure symptoms are improving Call or go to the ED if you have any new or worsening symptoms such as fever, worsening cough, shortness of breath, chest tightness, chest pain, turning blue, changes in mental status, etc...   

## 2019-08-28 NOTE — ED Triage Notes (Signed)
Pt presents with c/o flu symptoms, headache, fatigue and fever that began on Tuesday

## 2019-08-29 ENCOUNTER — Encounter: Payer: Self-pay | Admitting: Infectious Diseases

## 2019-08-29 ENCOUNTER — Telehealth: Payer: Self-pay | Admitting: Infectious Diseases

## 2019-08-29 DIAGNOSIS — Z6836 Body mass index (BMI) 36.0-36.9, adult: Secondary | ICD-10-CM | POA: Insufficient documentation

## 2019-08-29 DIAGNOSIS — U071 COVID-19: Secondary | ICD-10-CM

## 2019-08-29 NOTE — Telephone Encounter (Signed)
Chart reviewed re: Covid symptoms and the use of bamlanivimab, a monoclonal antibody infusion for those with mild to moderate Covid symptoms and at a high risk of hospitalization.  Pt is qualified for this infusion at the [facility] infusion center due to Diabetes and BMI>35.    Message left to call back/send MyChart if interested.   Sx started 12/29.

## 2019-10-02 ENCOUNTER — Other Ambulatory Visit: Payer: Self-pay | Admitting: Family Medicine

## 2019-10-02 DIAGNOSIS — R946 Abnormal results of thyroid function studies: Secondary | ICD-10-CM

## 2019-10-15 ENCOUNTER — Ambulatory Visit
Admission: RE | Admit: 2019-10-15 | Discharge: 2019-10-15 | Disposition: A | Discharge status: Home / Self Care | Payer: BC Managed Care – PPO | Source: Ambulatory Visit | Attending: Family Medicine | Admitting: Family Medicine

## 2019-10-15 DIAGNOSIS — R946 Abnormal results of thyroid function studies: Secondary | ICD-10-CM

## 2019-11-02 ENCOUNTER — Ambulatory Visit: Payer: Self-pay | Attending: Internal Medicine

## 2019-11-02 DIAGNOSIS — Z23 Encounter for immunization: Secondary | ICD-10-CM | POA: Insufficient documentation

## 2019-11-02 NOTE — Progress Notes (Signed)
   Covid-19 Vaccination Clinic  Name:  Mel[facility] Ray    MRN: [id number] DOB: 1980-09-01  11/02/2019  [name] was observed post Covid-19 immunization for 15 minutes without incident. She was provided with Vaccine Information Sheet and instruction to access the V-Safe system.   [name] was instructed to call 911 with any severe reactions post vaccine: Marland Kitchen Difficulty breathing  . Swelling of face and throat  . A fast heartbeat  . A bad rash all over body  . Dizziness and weakness   Immunizations Administered    Name Date Dose VIS Date Route   Pfizer COVID-19 Vaccine 11/02/2019  1:19 PM 0.3 mL 08/08/2019 Intramuscular   Manufacturer: [place]   Lot: [id number]   [place]: [id number]

## 2019-11-23 ENCOUNTER — Ambulatory Visit: Payer: Self-pay | Attending: Internal Medicine

## 2019-11-23 DIAGNOSIS — Z23 Encounter for immunization: Secondary | ICD-10-CM

## 2019-11-23 NOTE — Progress Notes (Signed)
   Covid-19 Vaccination Clinic  Name:  [facility] Ray    MRN: [id number] DOB: December 13, 1980  11/23/2019  [facility] Ray was observed post Covid-19 immunization for 15 minutes without incident. She was provided with Vaccine Information Sheet and instruction to access the V-Safe system.   [facility] Ray was instructed to call 911 with any severe reactions post vaccine: Marland Kitchen Difficulty breathing  . Swelling of face and throat  . A fast heartbeat  . A bad rash all over body  . Dizziness and weakness   Immunizations Administered    Name Date Dose VIS Date Route   Pfizer COVID-19 Vaccine 11/23/2019 11:44 AM 0.3 mL 08/08/2019 Intramuscular   Manufacturer: [place]   Lot: [id number]   [place]: [id number]

## 2020-08-10 IMAGING — US US ABDOMEN LIMITED
1 series · 14 of 25 positions shown · non-contrast
Comparison: None.

CLINICAL DATA: Elevated liver enzymes

EXAM:
ULTRASOUND ABDOMEN LIMITED RIGHT UPPER QUADRANT

[Series 1: us abdomen limited · 0.30mm/px · 14 of 38 slices shown]
[im 1/38]
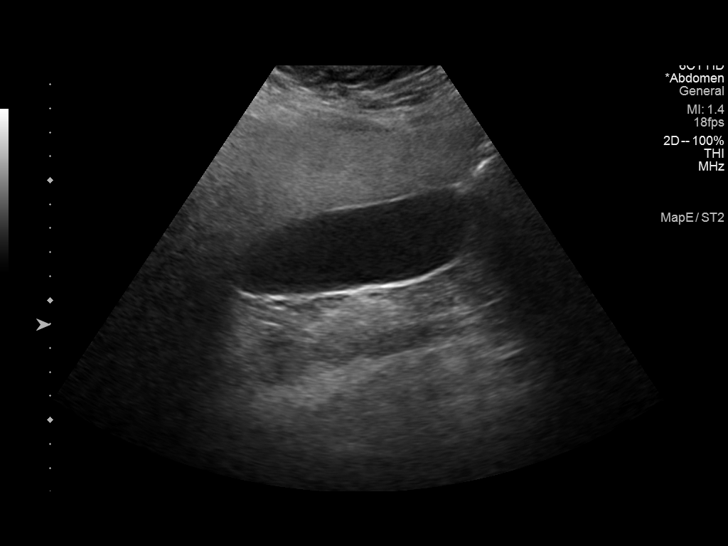
[im 4/38]
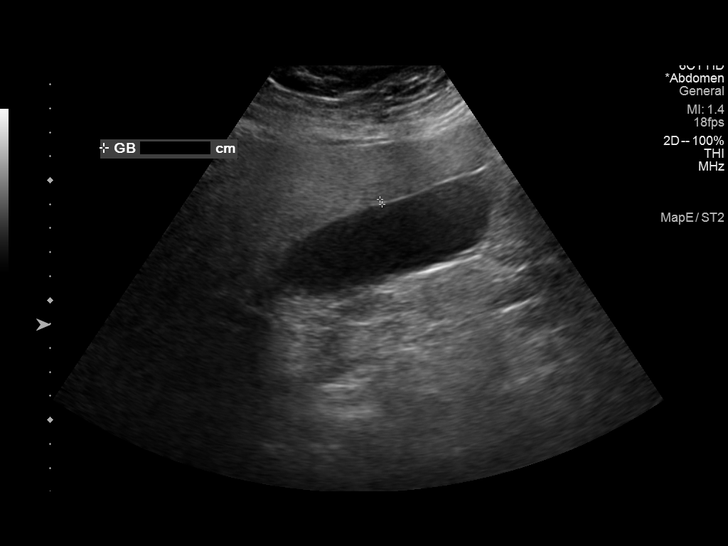
[im 7/38]
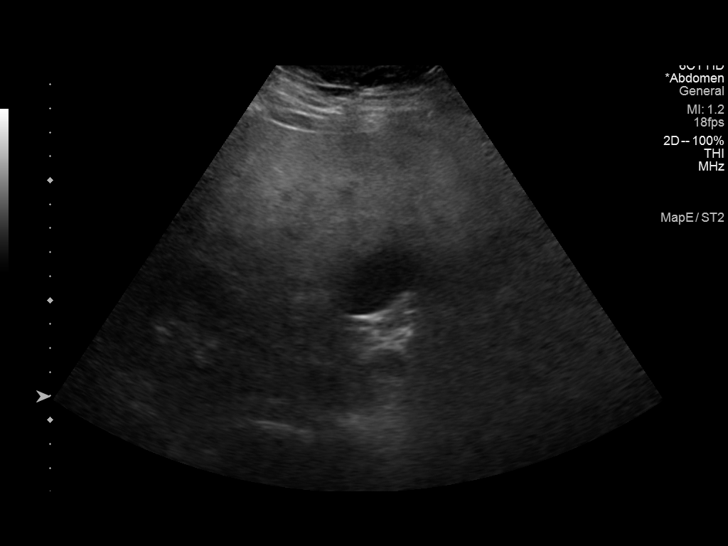
[im 10/38]
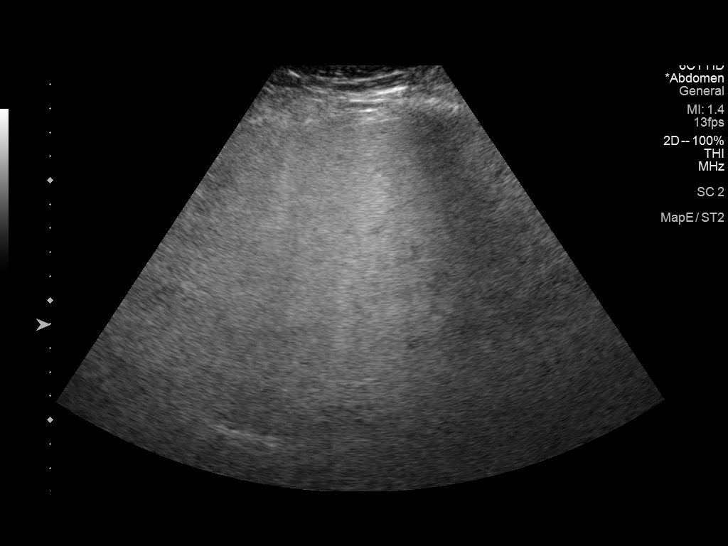
[im 13/38]
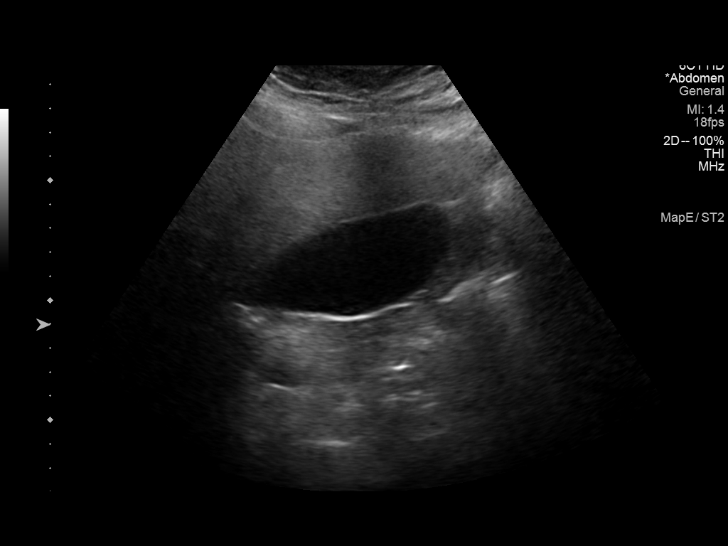
[im 14/38]
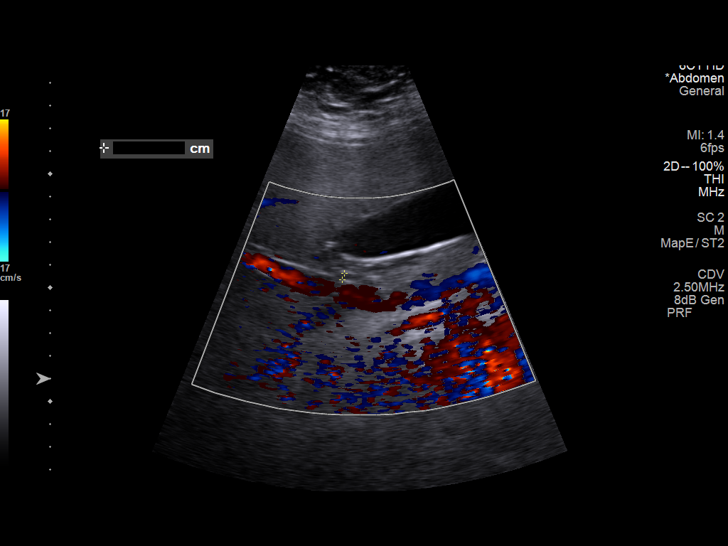
[im 17/38]
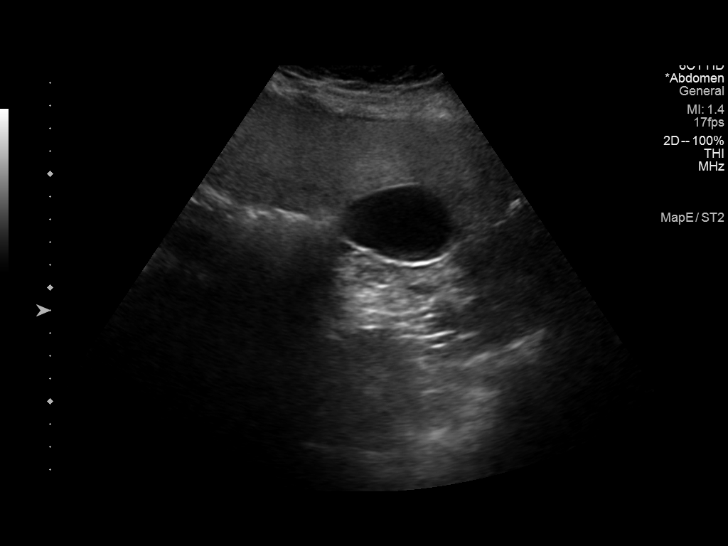
[im 21/38]
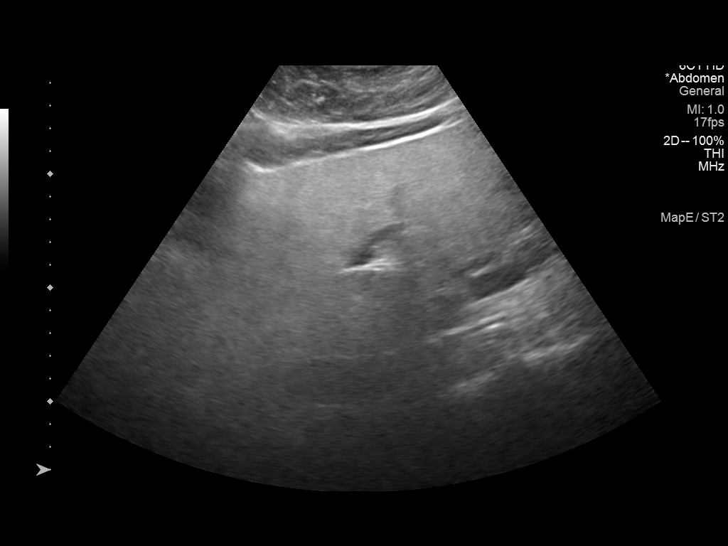
[im 24/38]
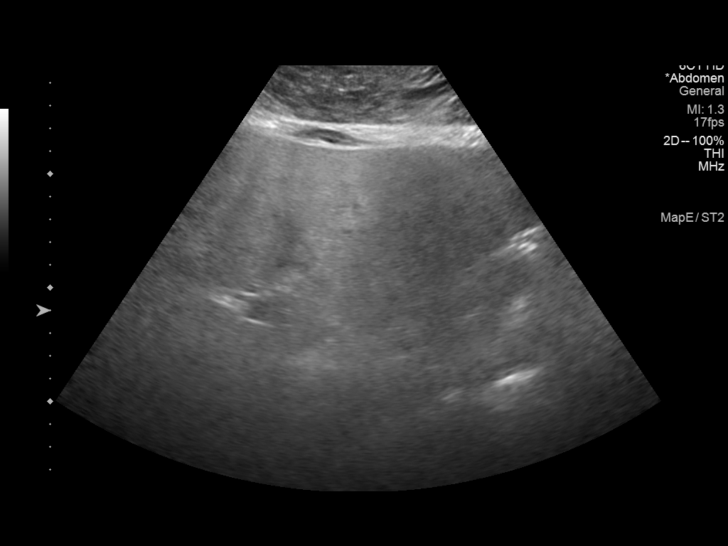
[im 25/38]
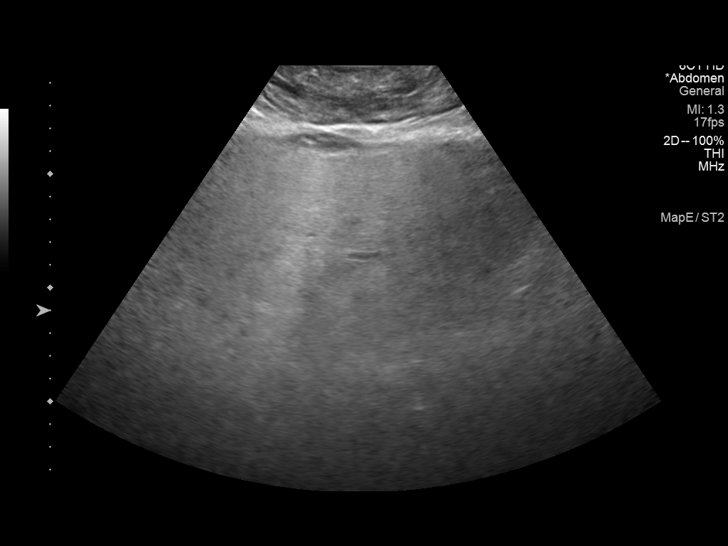
[im 28/38]
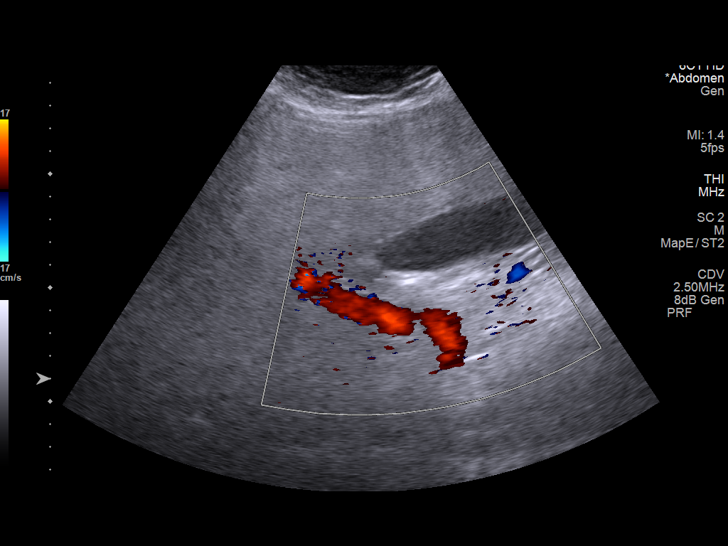
[im 31/38]
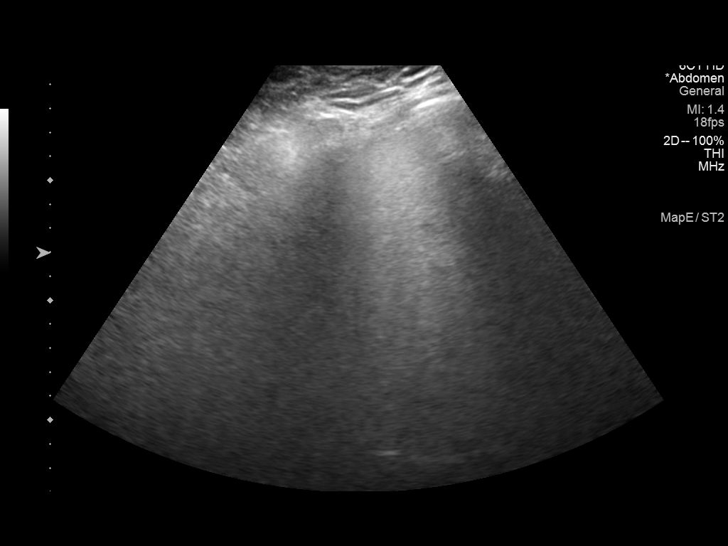
[im 34/38]
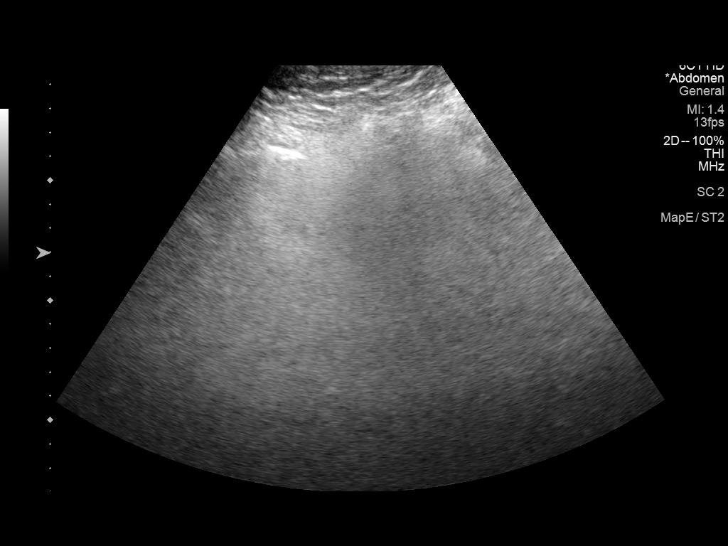
[im 38/38]
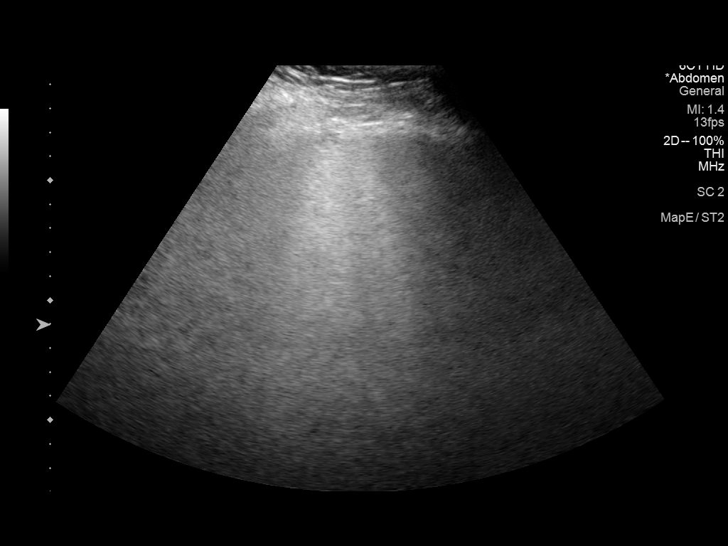

[14 of 25 positions shown; findings below may reference images not displayed]

FINDINGS: Gallbladder:

No gallstones or wall thickening visualized. There is no
pericholecystic fluid. No sonographic Murphy sign noted by
sonographer.

Common bile duct:

Diameter: 3 mm. No intrahepatic or extrahepatic biliary duct
dilatation.

Liver:

No focal lesion identified. Liver echogenicity is increased
diffusely. Portal vein is patent on color Doppler imaging with
normal direction of blood flow towards the liver.

Other: None.
IMPRESSION: 1. Diffuse increase in liver echotexture, a finding indicative of
hepatic steatosis with questionable underlying parenchymal liver
disease. While no focal liver lesions are evident on this study, it
must be cautioned that the sensitivity of ultrasound for detection
of focal liver lesions is diminished significantly in this
circumstance.

2.  Study otherwise unremarkable.
